# Patient Record
Sex: Female | Born: 1945
Health system: Southern US, Community
[De-identification: ages and names within clinical notes are randomized; demographics above are authoritative.]

## PROBLEM LIST (undated history)

## (undated) DIAGNOSIS — Z923 Personal history of irradiation: Secondary | ICD-10-CM

## (undated) DIAGNOSIS — Z720 Tobacco use: Secondary | ICD-10-CM

## (undated) DIAGNOSIS — J449 Chronic obstructive pulmonary disease, unspecified: Secondary | ICD-10-CM

## (undated) DIAGNOSIS — C50919 Malignant neoplasm of unspecified site of unspecified female breast: Secondary | ICD-10-CM

## (undated) HISTORY — DX: Personal history of irradiation: Z92.3

## (undated) HISTORY — PX: BREAST LUMPECTOMY: SHX2

---

## 2005-01-14 ENCOUNTER — Inpatient Hospital Stay: Payer: Self-pay | Admitting: Internal Medicine

## 2006-02-07 ENCOUNTER — Ambulatory Visit: Payer: Self-pay | Admitting: Infectious Diseases

## 2008-02-10 ENCOUNTER — Ambulatory Visit: Payer: Self-pay | Admitting: Family Medicine

## 2009-09-14 ENCOUNTER — Ambulatory Visit: Payer: Self-pay | Admitting: Unknown Physician Specialty

## 2010-06-27 ENCOUNTER — Ambulatory Visit: Payer: Self-pay | Admitting: Unknown Physician Specialty

## 2011-07-17 HISTORY — PX: BREAST BIOPSY: SHX20

## 2011-07-17 HISTORY — PX: BREAST LUMPECTOMY: SHX2

## 2011-11-05 ENCOUNTER — Ambulatory Visit: Payer: Self-pay | Admitting: Internal Medicine

## 2011-11-09 ENCOUNTER — Ambulatory Visit: Payer: Self-pay | Admitting: Internal Medicine

## 2011-12-13 ENCOUNTER — Ambulatory Visit: Payer: Self-pay | Admitting: Surgery

## 2011-12-19 ENCOUNTER — Ambulatory Visit: Payer: Self-pay | Admitting: Surgery

## 2011-12-19 LAB — CBC WITH DIFFERENTIAL/PLATELET
Basophil #: 0 10*3/uL (ref 0.0–0.1)
Eosinophil #: 0.1 10*3/uL (ref 0.0–0.7)
HGB: 15 g/dL (ref 12.0–16.0)
MCHC: 33.6 g/dL (ref 32.0–36.0)
Neutrophil %: 72.8 %
Platelet: 192 10*3/uL (ref 150–440)
RBC: 4.79 10*6/uL (ref 3.80–5.20)
WBC: 8.9 10*3/uL (ref 3.6–11.0)

## 2011-12-19 LAB — BASIC METABOLIC PANEL
BUN: 10 mg/dL (ref 7–18)
Calcium, Total: 8.7 mg/dL (ref 8.5–10.1)
Creatinine: 0.63 mg/dL (ref 0.60–1.30)
EGFR (African American): >60
Glucose: 81 mg/dL (ref 65–99)
Potassium: 4.1 mmol/L (ref 3.5–5.1)
Sodium: 141 mmol/L (ref 136–145)

## 2011-12-20 LAB — PATHOLOGY REPORT

## 2012-02-06 ENCOUNTER — Ambulatory Visit: Payer: Self-pay | Admitting: Surgery

## 2012-02-18 ENCOUNTER — Ambulatory Visit: Payer: Self-pay | Admitting: Oncology

## 2012-03-14 LAB — CBC CANCER CENTER
Basophil #: 0 x10 3/mm (ref 0.0–0.1)
Eosinophil #: 0.1 x10 3/mm (ref 0.0–0.7)
Eosinophil %: 1.5 %
Lymphocyte #: 2.2 x10 3/mm (ref 1.0–3.6)
MCH: 30.4 pg (ref 26.0–34.0)
MCV: 93 fL (ref 80–100)
Monocyte #: 0.7 x10 3/mm (ref 0.2–0.9)
Neutrophil %: 57.7 %
Platelet: 198 x10 3/mm (ref 150–440)
RBC: 4.67 10*6/uL (ref 3.80–5.20)
RDW: 13.9 % (ref 11.5–14.5)

## 2012-03-16 ENCOUNTER — Ambulatory Visit: Payer: Self-pay | Admitting: Oncology

## 2012-03-21 LAB — CBC CANCER CENTER
Basophil #: 0 x10 3/mm (ref 0.0–0.1)
Basophil %: 0.7 %
HCT: 41.8 % (ref 35.0–47.0)
HGB: 14.1 g/dL (ref 12.0–16.0)
Lymphocyte #: 2 x10 3/mm (ref 1.0–3.6)
MCH: 31 pg (ref 26.0–34.0)
MCHC: 33.7 g/dL (ref 32.0–36.0)
MCV: 92 fL (ref 80–100)
Monocyte %: 9.3 %
Neutrophil #: 3.7 x10 3/mm (ref 1.4–6.5)
Platelet: 203 x10 3/mm (ref 150–440)
RBC: 4.54 10*6/uL (ref 3.80–5.20)
RDW: 14 % (ref 11.5–14.5)

## 2012-03-28 LAB — CBC CANCER CENTER
Eosinophil #: 0.1 x10 3/mm (ref 0.0–0.7)
Eosinophil %: 1 %
HCT: 44.4 % (ref 35.0–47.0)
Lymphocyte #: 1.2 x10 3/mm (ref 1.0–3.6)
MCV: 94 fL (ref 80–100)
Monocyte %: 9.1 %
Neutrophil #: 4.9 x10 3/mm (ref 1.4–6.5)
Neutrophil %: 71.1 %
RBC: 4.75 10*6/uL (ref 3.80–5.20)
WBC: 6.8 x10 3/mm (ref 3.6–11.0)

## 2012-04-04 LAB — CBC CANCER CENTER
Basophil #: 0.1 x10 3/mm (ref 0.0–0.1)
Basophil %: 0.9 %
Eosinophil #: 0.1 x10 3/mm (ref 0.0–0.7)
HCT: 40.9 % (ref 35.0–47.0)
HGB: 13.9 g/dL (ref 12.0–16.0)
MCH: 31.3 pg (ref 26.0–34.0)
MCHC: 34 g/dL (ref 32.0–36.0)
Monocyte #: 0.6 x10 3/mm (ref 0.2–0.9)
Neutrophil #: 3.3 x10 3/mm (ref 1.4–6.5)
Neutrophil %: 59.6 %
RDW: 13.4 % (ref 11.5–14.5)
WBC: 5.5 x10 3/mm (ref 3.6–11.0)

## 2012-04-11 ENCOUNTER — Other Ambulatory Visit: Payer: Self-pay | Admitting: Internal Medicine

## 2012-04-11 LAB — TSH: Thyroid Stimulating Horm: 0.633 u[IU]/mL

## 2012-04-11 LAB — CBC CANCER CENTER
Basophil %: 0.6 %
Eosinophil #: 0.1 x10 3/mm (ref 0.0–0.7)
Eosinophil %: 2.2 %
HGB: 13.9 g/dL (ref 12.0–16.0)
Lymphocyte #: 1.1 x10 3/mm (ref 1.0–3.6)
MCH: 30.6 pg (ref 26.0–34.0)
MCHC: 32.5 g/dL (ref 32.0–36.0)
MCV: 94 fL (ref 80–100)
Monocyte #: 0.7 x10 3/mm (ref 0.2–0.9)
Monocyte %: 13.6 %
Neutrophil #: 3.3 x10 3/mm (ref 1.4–6.5)
RDW: 13.8 % (ref 11.5–14.5)

## 2012-04-15 ENCOUNTER — Ambulatory Visit: Payer: Self-pay | Admitting: Oncology

## 2012-04-18 LAB — CBC CANCER CENTER
Basophil #: 0.1 x10 3/mm (ref 0.0–0.1)
Basophil %: 0.9 %
Eosinophil #: 0.1 x10 3/mm (ref 0.0–0.7)
Lymphocyte #: 1.3 x10 3/mm (ref 1.0–3.6)
MCH: 30.6 pg (ref 26.0–34.0)
MCV: 94 fL (ref 80–100)
Monocyte #: 0.7 x10 3/mm (ref 0.2–0.9)
Platelet: 166 x10 3/mm (ref 150–440)
RDW: 13.7 % (ref 11.5–14.5)
WBC: 5.7 x10 3/mm (ref 3.6–11.0)

## 2012-04-25 LAB — CBC CANCER CENTER
Basophil #: 0 x10 3/mm (ref 0.0–0.1)
Basophil %: 0.5 %
Eosinophil %: 1.9 %
HGB: 13.9 g/dL (ref 12.0–16.0)
Lymphocyte %: 24.2 %
MCV: 93 fL (ref 80–100)
Monocyte %: 16.5 %
Neutrophil #: 2.6 x10 3/mm (ref 1.4–6.5)
Neutrophil %: 56.9 %
RDW: 13.6 % (ref 11.5–14.5)

## 2012-05-16 ENCOUNTER — Ambulatory Visit: Payer: Self-pay | Admitting: Oncology

## 2012-06-15 ENCOUNTER — Ambulatory Visit: Payer: Self-pay | Admitting: Oncology

## 2012-07-16 ENCOUNTER — Ambulatory Visit: Payer: Self-pay | Admitting: Oncology

## 2012-08-19 ENCOUNTER — Ambulatory Visit: Payer: Self-pay | Admitting: Oncology

## 2012-09-13 ENCOUNTER — Ambulatory Visit: Payer: Self-pay | Admitting: Oncology

## 2012-10-23 ENCOUNTER — Ambulatory Visit: Payer: Self-pay | Admitting: Internal Medicine

## 2012-10-30 ENCOUNTER — Ambulatory Visit: Payer: Self-pay | Admitting: Oncology

## 2012-10-30 ENCOUNTER — Ambulatory Visit: Payer: Self-pay | Admitting: Internal Medicine

## 2013-04-20 ENCOUNTER — Observation Stay: Payer: Self-pay | Admitting: Internal Medicine

## 2013-04-20 LAB — URINALYSIS, COMPLETE
Bacteria: NONE SEEN
Glucose,UR: NEGATIVE mg/dL (ref 0–75)
Glucose,UR: NEGATIVE mg/dL (ref 0–75)
Leukocyte Esterase: NEGATIVE
Nitrite: NEGATIVE
Protein: NEGATIVE
RBC,UR: <1 /HPF (ref 0–5)
Specific Gravity: 1.01 (ref 1.003–1.030)
WBC UR: 1 /HPF (ref 0–5)
WBC UR: 2 /HPF (ref 0–5)

## 2013-04-20 LAB — COMPREHENSIVE METABOLIC PANEL
Albumin: 3.8 g/dL (ref 3.4–5.0)
Anion Gap: 2 — ABNORMAL LOW (ref 7–16)
Bilirubin,Total: 0.4 mg/dL (ref 0.2–1.0)
Calcium, Total: 9.1 mg/dL (ref 8.5–10.1)
EGFR (African American): >60
EGFR (Non-African Amer.): >60
Glucose: 82 mg/dL (ref 65–99)
Osmolality: 274 (ref 275–301)
SGPT (ALT): 19 U/L (ref 12–78)
Sodium: 139 mmol/L (ref 136–145)
Total Protein: 7.5 g/dL (ref 6.4–8.2)

## 2013-04-20 LAB — CBC WITH DIFFERENTIAL/PLATELET
Eosinophil %: 0.3 %
HCT: 46.4 % (ref 35.0–47.0)
HGB: 15.7 g/dL (ref 12.0–16.0)
Lymphocyte #: 1.3 10*3/uL (ref 1.0–3.6)
Monocyte %: 6.8 %
Neutrophil %: 74.1 %
Platelet: 180 10*3/uL (ref 150–440)
RBC: 4.97 10*6/uL (ref 3.80–5.20)
RDW: 14 % (ref 11.5–14.5)

## 2013-04-20 LAB — LIPASE, BLOOD: Lipase: 157 U/L (ref 73–393)

## 2013-04-20 LAB — T4, FREE: Free Thyroxine: 1.49 ng/dL — ABNORMAL HIGH (ref 0.76–1.46)

## 2013-04-21 LAB — CBC WITH DIFFERENTIAL/PLATELET
Basophil %: 0.6 %
Eosinophil #: 0.1 10*3/uL (ref 0.0–0.7)
Eosinophil %: 1.6 %
HCT: 40.6 % (ref 35.0–47.0)
Lymphocyte #: 1.5 10*3/uL (ref 1.0–3.6)
Lymphocyte %: 25.8 %
MCHC: 34.4 g/dL (ref 32.0–36.0)
MCV: 94 fL (ref 80–100)
Monocyte #: 0.6 x10 3/mm (ref 0.2–0.9)
Monocyte %: 10.6 %
Neutrophil #: 3.5 10*3/uL (ref 1.4–6.5)
Neutrophil %: 61.4 %
Platelet: 169 10*3/uL (ref 150–440)
RBC: 4.33 10*6/uL (ref 3.80–5.20)
RDW: 14.1 % (ref 11.5–14.5)

## 2013-04-21 LAB — BASIC METABOLIC PANEL
Anion Gap: 5 — ABNORMAL LOW (ref 7–16)
Calcium, Total: 8.6 mg/dL (ref 8.5–10.1)
Creatinine: 0.68 mg/dL (ref 0.60–1.30)
EGFR (African American): >60
Glucose: 74 mg/dL (ref 65–99)
Osmolality: 275 (ref 275–301)

## 2013-04-21 LAB — LIPID PANEL
Cholesterol: 159 mg/dL (ref 0–200)
Ldl Cholesterol, Calc: 102 mg/dL — ABNORMAL HIGH (ref 0–100)
Triglycerides: 67 mg/dL (ref 0–200)
VLDL Cholesterol, Calc: 13 mg/dL (ref 5–40)

## 2013-04-21 LAB — TSH: Thyroid Stimulating Horm: 0.97 u[IU]/mL

## 2013-04-22 LAB — PATHOLOGY REPORT

## 2013-05-05 ENCOUNTER — Ambulatory Visit: Payer: Self-pay | Admitting: Urgent Care

## 2013-05-12 ENCOUNTER — Ambulatory Visit: Payer: Self-pay | Admitting: Urgent Care

## 2013-11-25 ENCOUNTER — Ambulatory Visit: Payer: Self-pay | Admitting: Surgery

## 2014-07-19 DIAGNOSIS — M7989 Other specified soft tissue disorders: Secondary | ICD-10-CM | POA: Diagnosis not present

## 2014-07-19 DIAGNOSIS — E039 Hypothyroidism, unspecified: Secondary | ICD-10-CM | POA: Diagnosis not present

## 2014-07-19 DIAGNOSIS — H9212 Otorrhea, left ear: Secondary | ICD-10-CM | POA: Diagnosis not present

## 2014-07-28 DIAGNOSIS — M67442 Ganglion, left hand: Secondary | ICD-10-CM | POA: Diagnosis not present

## 2014-08-20 DIAGNOSIS — H6060 Unspecified chronic otitis externa, unspecified ear: Secondary | ICD-10-CM | POA: Diagnosis not present

## 2014-08-20 DIAGNOSIS — H9190 Unspecified hearing loss, unspecified ear: Secondary | ICD-10-CM | POA: Diagnosis not present

## 2014-10-14 ENCOUNTER — Ambulatory Visit
Admit: 2014-10-14 | Disposition: A | Discharge status: Home / Self Care | Payer: Self-pay | Attending: Specialist | Admitting: Specialist

## 2014-10-21 DIAGNOSIS — R0602 Shortness of breath: Secondary | ICD-10-CM | POA: Diagnosis not present

## 2014-10-21 DIAGNOSIS — E039 Hypothyroidism, unspecified: Secondary | ICD-10-CM | POA: Diagnosis not present

## 2014-10-21 DIAGNOSIS — Z77120 Contact with and (suspected) exposure to mold (toxic): Secondary | ICD-10-CM | POA: Diagnosis not present

## 2014-10-21 DIAGNOSIS — R5382 Chronic fatigue, unspecified: Secondary | ICD-10-CM | POA: Diagnosis not present

## 2014-10-21 DIAGNOSIS — R05 Cough: Secondary | ICD-10-CM | POA: Diagnosis not present

## 2014-10-27 DIAGNOSIS — R05 Cough: Secondary | ICD-10-CM | POA: Diagnosis not present

## 2014-10-27 DIAGNOSIS — Z77120 Contact with and (suspected) exposure to mold (toxic): Secondary | ICD-10-CM | POA: Diagnosis not present

## 2014-10-29 ENCOUNTER — Other Ambulatory Visit: Payer: Self-pay | Admitting: Internal Medicine

## 2014-10-29 DIAGNOSIS — Z853 Personal history of malignant neoplasm of breast: Secondary | ICD-10-CM

## 2014-11-02 NOTE — Consult Note (Signed)
Reason for Visit: This 69 year old Female patient presents to the clinic for initial evaluation of  Breast cancer .   Referred by Dr. Tamala Julian.  Diagnosis:   Chief Complaint/Diagnosis   69 year old female with stage 0 (Tis N0 M0) ductal carcinoma in situ ER positive PR negative status post wide local excision   Pathology Report Pathology were reviewed    Imaging Report Mammograms and ultrasound reviewed    Referral Report Clinical notes reviewed    Planned Treatment Regimen Adjuvant whole breast radiation    HPI   patient is a pleasant 69 year old female who presents with an abnormal mammogram of the right breast. There were actually 2 separate groups of suspicious microcalcifications in the superior lateral right breast she underwent stereotactic biopsy positive for ductal carcinoma in situ. She then underwent wide local excision for a 1 cm DCIS in the inferior medial portion upper quadrant and a 0.6 cm DCIS lesion in the superior lateral portion of the quadrant. It was apparent these were abutting each other and probably consists of the same lesion. Tumor again was ER positive PR negative. Margins were clear but close at 0.5 mm and 1 mm respectively. She has done well since her surgery. She is has no complaints. She specifically denies breast tenderness cough or bone pain. She has been seen by medical oncology whose May recommendation for post treatment tamoxifen. She seen today for radiation oncology opinion.  Past Hx:    right breast cancer: 2013   Tobacco Use:    Ovarian Cyst:    Thyroidectomy:   Past, Family and Social History:   Past Medical History positive    Endocrine hypothyroidism; Status post thyroidectomy    Past Surgical History Benign ovarian cyst removed    Family History positive    Family History Comments Of with prostate cancer, mother with COPD and thyroid disease    Social History positive    Social History Comments 40-pack-year smoking history, no EtOH  abuse history    Additional Past Medical and Surgical History Patient is seen by yourself today   Allergies:   Amoxicillin: Anaphylaxis  Morphine: N/V  Sulfa drugs: N/V, Other  Seafood: N/V  Celebrex: Rash  Prednisone: Swelling  Splenda causes dizziness: Other  Proventil: Other, Resp. Distress, Tachycardia  Home Meds:  Home Medications: Medication Instructions Status  levothyroxine 50 MCG DAILY  Active  Norco 5 mg-325 mg oral tablet 1 tab(s) orally as directed Active   Review of Systems:   General negative    Performance Status (ECOG) 0    Skin negative    Breast see HPI    Ophthalmologic negative    ENMT negative    Respiratory and Thorax negative    Cardiovascular negative    Gastrointestinal negative    Genitourinary negative    Musculoskeletal negative    Neurological negative    Psychiatric negative    Hematology/Lymphatics negative    Endocrine negative    Allergic/Immunologic negative    Review of Systems   Patient denies any weight loss, fatigue, weakness, fever, chills or night sweats. Patient denies any loss of vision, blurred vision. Patient denies any ringing  of the ears or hearing loss. No irregular heartbeat. Patient denies heart murmur or history of fainting. Patient denies any chest pain or pain radiating to her upper extremities. Patient denies any shortness of breath, difficulty breathing at night, cough or hemoptysis. Patient denies any swelling in the lower legs. Patient denies any nausea vomiting, vomiting of blood,  or coffee ground material in the vomitus. Patient denies any stomach pain. Patient states has had normal bowel movements no significant constipation or diarrhea. Patient denies any dysuria, hematuria or significant nocturia. Patient denies any problems walking, swelling in the joints or loss of balance. Patient denies any skin changes, loss of hair or loss of weight. Patient denies any excessive worrying or anxiety or  significant depression. Patient denies any problems with insomnia. Patient denies excessive thirst, polyuria, polydipsia. Patient denies any swollen glands, patient denies easy bruising or easy bleeding. Patient denies any recent infections, allergies or URI. Patient "s visual fields have not changed significantly in recent time.  Nursing Notes:  Nursing Vital Signs and Chemo Nursing Nursing Notes: *CC Vital Signs Flowsheet:   05-Aug-13 15:08   Temp Temperature 99   Pulse Pulse 70   Respirations Respirations 22   SBP SBP 158   DBP DBP 85   Pain Scale (0-10)  0   Current Weight (kg) (kg) 77.2   Height (cm) centimeters 168   BSA (m2) 1.8   Physical Exam:  General/Skin/HEENT:   General normal    Skin normal    Eyes normal    ENMT normal    Head and Neck normal    Additional PE Well-developed well-nourished female in NAD. Right breast has a recent wide local excision scar which is healed well. No dominant mass or nodularity is noted in either breast into position examined. No axillary or supraclavicular adenopathy is appreciated.   Breasts/Resp/CV/GI/GU:   Respiratory and Thorax normal    Cardiovascular normal    Gastrointestinal normal    Genitourinary normal   MS/Neuro/Psych/Lymph:   Musculoskeletal normal    Neurological normal    Lymphatics normal   Assessment and Plan:  Impression:   ductal carcinoma in situ of the right breast status post wide local excision ER positive PR negative in 69 year old female  Plan:   at this time based on the close margins. The 2 separate nodules believe the patient would benefit from whole breast radiation therapy. We treated to 5000 cGy and boost her margins another 2000 cGy based on the close margin of 0.5 mm. Patient also be a candidate after radiation for tamoxifen therapy. Risks and benefits of treatment including skin reaction, fatigue and some inclusion of superficial lung were all explained in detail to the patient. I have  set her up for CT simulation next week.  I would like to take this opportunity to thank you for allowing me to continue to participate in this patient's care.  CC Referral:   cc: Dr. Tamala Julian, Dr. Marla Roe   Electronic Signatures: Baruch Gouty, Roda Shutters (MD)  (Signed 09-Aug-13 14:09)  Authored: HPI, Diagnosis, Past Hx, PFSH, Allergies, Home Meds, ROS, Nursing Notes, Physical Exam, Encounter Assessment and Plan, CC Referring Physician   Last Updated: 09-Aug-13 14:09 by Armstead Peaks (MD)

## 2014-11-02 NOTE — Op Note (Signed)
PATIENT NAME:  Kristin Vega, Kristin Vega MR#:  [id number] DATE OF BIRTH:  [birth year]  DATE OF PROCEDURE:  02/06/2012  PREOPERATIVE DIAGNOSIS: Ductal carcinoma and in situ of the upper outer quadrant right breast.   POSTOPERATIVE DIAGNOSIS: Ductal carcinoma and in situ of the upper outer quadrant right breast.   PROCEDURE: Right partial mastectomy.   SURGEON: Loreli Dollar, MD  ANESTHESIA: General.  INDICATIONS: This 69 year old female recently had a mammogram depicting two foci microcalcifications. She had stereotactic needle biopsies demonstrating both sites ductal carcinoma in situ and surgery was recommended for definitive treatment. The patient did have preoperative x-ray needle localization. These images were reviewed and appeared to be very accurate localizations identifying the biopsy sites.   DESCRIPTION OF PROCEDURE: The patient was placed on the operating table in the supine position under general anesthesia. The dressing was removed from the right breast. Kopans wires were identified and were cut 3 cm from the skin. The breast was prepared with ChloraPrep, draped in a sterile manner.   The needle entrance sites were observed in the upper outer quadrant. An incision was made so as to encompass both sites and it was obliquely oriented beginning just lateral to the nipple and extending up towards the axilla and removed an ellipse of skin which was approximately 1.5 cm wide and dissected down through subcutaneous tissues and dissected around the sites of the Kopans wires and dissected down into the glandular tissue of the breast down adjacent to the muscle wall and excised the portion of tissue. It appeared that the margin was somewhat minimal just above the wire in the upper outer quadrant and another excision of a portion of tissue approximately 3 x 3 x 7 mm in thickness was made and this was sutured with three-point fixation to the larger mass to allow the pathologist to tell where the  re-excision had taken place. The 4:00 end of the skin ellipse was tagged with a stitch for the pathologist's orientation and the specimen was submitted for gross inspection and routine pathology. The wound was inspected. There was no remaining mass visible or palpable within the wound. Several small bleeding points were cauterized. Hemostasis subsequently appeared to be intact. The wound was injected with 16 mL of 0.5% Sensorcaine with epinephrine. Next, the subcutaneous tissues were approximated with 5-0 Monocryl and then the skin was closed with running 5-0 Monocryl subcuticular suture.   Pathologist subsequently called back to report there was no grossly apparent tumor at the cut margins and it was somewhat difficult to identify the biopsy site and tissue submitted for permanent studies. Next, the wound was dressed with Dermabond, allowed to dry. The patient tolerated surgery satisfactorily and was then prepared for transfer to the recovery room.  ____________________________ Lenna Sciara. Rochel Brome, MD jws:cms D: 02/06/2012 11:09:57 ET T: 02/06/2012 11:32:20 ET JOB#: [id number]  cc: Loreli Dollar, MD, <Dictator>  Report entered as incorrect report type; entered as an history and physical and should be an operative report.   Loreli Dollar MD ELECTRONICALLY SIGNED 02/06/2012 18:58

## 2014-11-05 NOTE — Discharge Summary (Signed)
PATIENT NAME:  Kristin Vega, Kristin Vega MR#:  [id number] DATE OF BIRTH:  [birth year]  DATE OF ADMISSION:  04/20/2013 DATE OF DISCHARGE:  04/21/2013  ADMISSION DIAGNOSIS: Dysphagia.   DISCHARGE DIAGNOSIS: Dysphagia with a small hiatal hernia on EGD showing a benign-appearing esophageal stricture status post dilation.   CONSULTATIONS: Dr. Allen Norris.   PROCEDURES: The patient underwent an EGD which showed a hiatal hernia, benign-appearing esophageal stricture which was dilated, gastritis and a normal examined duodenum.   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   LABORATORY DATA ON DISCHARGE: White blood cells is 5.6, hemoglobin 13.9, hematocrit 40.6, platelets are 169. Sodium 140, potassium 3.9, chloride 108, bicarbonate 27, BUN 5, creatinine 0.68, glucose 74. LDL of 102, VLDL 13, HDL 44, triglyceride 67, cholesterol  159. TSH 0.970.  HOSPITAL COURSE: This is a 69 year old female who presented to the hospital with dysphasia. For further details, please refer to the H and P.  1. Dysphagia. The patient said that she been having dysphagia for four weeks. She says it is to  both solids and liquids; however, then she does state that she is able to drink milk and eat raisin bran cereal. Consultation was performed with by GI Vickey Huger and EGD was performed by Dr. Allen Norris. It did show a hiatal hernia and she did have an esophageal stricture which was dilated, which is likely the cause of her dysphagia. Biopsies were taken to rule out eosinophilic gastritis. She will continue on a PPI and have follow-up with Vickey Huger in 7 to 10 days.  2. Hypothyroidism. She was continued on Synthroid.  3. Smoking dependence. We encouraged the patient to stop smoking. She was discharged with a nicotine patch.   DISCHARGE MEDICATIONS:  1. Synthroid 75 mcg daily.  2. Nicotine patch 21 mg per 24 hours.  3. Pantoprazole 40 mg daily.   DISCHARGE DIET: Regular diet.   DISCHARGE ACTIVITY: As tolerated.   DISCHARGE  FOLLOW-UP: The patient will follow up with Vickey Huger, nurse practioner for  Dr. Allen Norris in 10 days.   TIME SPENT: Approximately 35 minutes.  The patient is medically stable for discharge.   ____________________________ Rebekka Lobello P. Benjie Karvonen, MD spm:sg D: 04/21/2013 12:11:27 ET T: 04/21/2013 12:37:53 ET JOB#: [id number]  cc: Dewitt Judice P. Benjie Karvonen, MD, <Dictator> Andria Meuse, NP Lucilla Lame, MD  Donell Beers Saranya Harlin MD ELECTRONICALLY SIGNED 04/21/2013 20:48

## 2014-11-05 NOTE — H&P (Signed)
PATIENT NAME:  Kristin Vega, Kristin Vega MR#:  [id number] DATE OF BIRTH:  24-Aug-1945  DATE OF ADMISSION:  04/20/2013  PRIMARY CARE PHYSICIAN: Valentino Saxon, MD  REFERRING PHYSICIAN: Delman Kitten, MD   CHIEF COMPLAINT: Swallowing difficulty.   HISTORY OF PRESENT ILLNESS: A 69 year old female who has history of hypothyroidism following hemithyroidectomy due to thyroid nodules which were benign 10 years ago and since then on thyroid replacement. Because of her breast cancer and lumpectomy, she received tamoxifen 10 months ago for 3 months scores and stopped in January 2014. She had to stop it because of severe reactions, and she was feeling very weak and swollen and breaking out.  So, they had to stop tamoxifen, and since she stopped tamoxifen she never recovered fully. She has been gaining weight, almost 20 pounds in the last 7 to 8 months. She has been losing her hair, and she feels her voice is hoarse. Also, she felt some change in her taste sensation, and she feels nauseated even by looking at some of the foods.  For the last few weeks, her swallowing problem got so worse that she could hardly eat or drink some food, and so she spoke to her surgeon doctor who did the breast lumpectomy, and he referred to GI clinic.  She saw the PA of Dr. Rayann Heman in clinic, and they scheduled her for endoscopy at the end of this month, but she could not tolerate this problem anymore and decided to come to Emergency Room today. On initial work-up, her laboratory results are within acceptable range; but because of this progressive complaint and not able to tolerate food properly, we are going to admit her under observation to get the work-up and GI consult faster.   PAST MEDICAL HISTORY:   1.  Hypothyroidism, on replacement, had some thyroid nodules and hemithyroidectomy done 10 years ago.  2.  Breast cancer which was localized to the lump, and they did lumpectomy and received tamoxifen for 3 months.   PAST SURGICAL HISTORY:  Thyroid surgery and breast lumpectomy.   SOCIAL HISTORY: She is a smoker, is smoking 1 pack per day. Denies alcohol or denies any drug use. She is retired now and is on [employer] income. She is widowed.   FAMILY HISTORY: Father has some kidney problem, but he died when she was 69 years old, so does not know any more history.  Mother had thyroid problems, and she died at 19 because of severe complications of COPD.  Her cousin has breast and uterine cancer.   HOME MEDICATIONS: Levothyroxine 75 mcg once daily.   PHYSICAL EXAMINATION:  VITAL SIGNS: In the ER, temperature 98.6, pulse 79, respirations 18, and blood pressure 143/73, pulse ox 96 on room air.  GENERAL: The patient is fully alert and oriented to time, place and person and does not appear in any acute distress.  HEENT: Head and neck atraumatic. Conjunctivae pink. Oral mucosa moist.  NECK: Supple. No JVD.  RESPIRATORY: Bilateral clear and equal air entry.  CARDIOVASCULAR: S1, S2 present, regular. No murmur.  ABDOMEN: Soft, nontender, bowel sounds present. No organomegaly.  SKIN: No rashes.  LEGS: No edema.  NEUROLOGICAL: Power 5 out of 5. Follows commands. No gross abnormality  PSYCHIATRIC: Does not appear in any acute psychiatric illness at this time.   IMPORTANT LAB AND DIAGNOSTIC RESULTS:   Glucose 82, BUN 4, creatinine 0.79, sodium 139, potassium 4.2, chloride 106, CO2 31, lipase 157, total protein 7.5, bilirubin 0.4, alkaline phosphatase 120, SGOT 22, SGPT 19.  WBC 7.1, hemoglobin 15.7, platelet count 180, MCV is 93. Urinalysis is grossly negative. Chest x-ray, PA and lateral: No acute cardiopulmonary disease. Changes of COPD and atherosclerotic disease.  Soft tissue neck:    ASSESSMENT AND PLAN: A 69 year old female with past medical history of hemithyroidectomy due to thyroid nodules for the last 7 to 8 months, has been feeling weak, fatigued, not eating well due to swallowing problem, losing hair and gaining weight and has  hoarse sound.   Denies any temperature intolerance or constipation.   1.  Dysphagia:  She was scheduled to undergo outpatient endoscopy by Dr. Jackalyn Lombard office.  Will get barium swallow done and will call GI consult for further management of this issue. Most likely it might be due to hormonal imbalance.  2.  Possible hypothyroidism:  As the patient's symptoms are suggesting towards hypothyroidism, we will get TSH, free T3 and free T4 levels stat, and, if needed, we might consult Endocrinology for further management. There is no thyroid enlargement on gross examination.  3.  Tobacco abuse: She is a smoker, currently smokes 1 pack per day. Smoking cessation counseling done for 5 minutes and offered a nicotine patch.   CODE STATUS:  FULL CODE.    TOTAL TIME SPENT: 50 minutes in this admission.   ____________________________ Ceasar Lund Anselm Jungling, MD vgv:cb D: 04/20/2013 15:52:08 ET T: 04/20/2013 16:33:43 ET JOB#: [id number]  cc: Ceasar Lund. Anselm Jungling, MD, <Dictator> Vaughan Basta MD ELECTRONICALLY SIGNED 04/27/2013 23:00

## 2014-11-05 NOTE — Consult Note (Signed)
Brief Consult Note: Diagnosis: Dysphagia.   Patient was seen by consultant.   Consult note dictated.   Comments: Kristin Vega is a pleasant 69 y/o caucasian female with chronic dysphagia & globus.  She will have EGD with Dr Allen Norris tomorrow with possible esophageal dilation to look for occult web, ring, stricture or post-radiation changes.  Plan: 1) NPO after MN 2) EGD with Dr Allen Norris in AM 3) PPI 4) Outpatient screening colonoscopy recommended 5) Hold heparin 2300 for procedure  Thanks for Consult.  Please see full dictated note [phone].  Electronic Signatures: Andria Meuse (NP)  (Signed 06-Oct-14 19:30)  Authored: Brief Consult Note   Last Updated: 06-Oct-14 19:30 by Andria Meuse (NP)

## 2014-11-05 NOTE — Consult Note (Signed)
PATIENT NAME:  Kristin Vega, Kristin Vega MR#:  [id number] DATE OF BIRTH:  [birth year]  DATE OF CONSULTATION:  04/20/2013  CONSULTANT:  Dr. Lucilla Lame   PRIMARY CARE PHYSICIAN: Previously Dr. Heber Shongopovi, has upcoming appointment with Dr. Candiss Norse.   REQUESTING PHYSICIAN: Dr. Anselm Jungling, Gastroenterologist  PRIMARY GASTROENTEROLOGIST: Dr. Rayann Heman  REASON FOR CONSULTATION: Dysphagia.   HISTORY OF PRESENT ILLNESS: Kristin Vega is a 69 year old Caucasian female who has had chronic dysphagia. She was actually seen by Dr. Jackalyn Lombard PA, Maurice March, and scheduled for EGD at the end of the month, but says she could not tolerate the problem any longer, and decided to come to the ER today. She reports even if she looks at food, she begins gagging. She has had symptoms for at least 5 weeks now. She complains of a globus sensation. She has problems with both solids and liquids. Even chicken broth seems to well up in the back of her throat, and will not go down. She has had frequent belching. She complains of gurgling within an hour of eating in her stomach. She complains of frequent gas and bloating. She has heartburn for the first time ever after eating stuffed peppers a couple of weeks ago. She has had indigestion for the last 5 weeks. She has had episodes where she feels like she is strangling with coughing spells. She has a history of breast cancer diagnosed in April 2013, status post lumpectomy, chemotherapy and radiation that she completed about one year ago. She has chronic intermittent nausea without vomiting. She gained about 20 pounds last year, but her weight has been steady over the past year. She denies rectal bleeding or melena. Denies any diarrhea or constipation. She has never had a screening colonoscopy.   She had a CT soft tissue of her neck, which was benign. She had a chest x-ray, which showed changes of COPD and atherosclerotic disease. She had a barium swallow, which showed a small hiatal hernia with a small B  ring of the distal esophagus.   PAST SURGICAL HISTORY: Thyroid nodule, status post partial thyroidectomy, breast cancer diagnosed April 2013, followed by chemotherapy and radiation, as well as lumpectomies.    PAST MEDICAL HISTORY: COPD.   MEDICATIONS PRIOR TO ADMISSION:  Levothyroxine 75 mcg daily.   ALLERGIES: AMOXICILLIN caused anaphylaxis. CELEBREX, rash. MORPHINE, nausea and vomiting. PREDNISONE, swelling. PROVENTIL, respiratory distress and tachycardia. SULFA, nausea and vomiting. SPLENDA, dizziness. SEAFOOD, nausea and vomiting.   FAMILY HISTORY: There is no known family history of colorectal carcinoma, liver or chronic GI problems. Mother deceased secondary to respiratory issues, she also had thyroid disease.   SOCIAL HISTORY:  She is divorced. She lives alone. She has one healthy daughter, who lives in [place]. She has 2 grandchildren. She occasionally consumes a glass of wine. Denies any illicit drug use. She has a 30+ pack-year history of tobacco use. She is retired.   REVIEW OF SYSTEMS: See HPI, otherwise negative 12-point review of systems.  PHYSICAL EXAMINATION: VITAL SIGNS: Temp 98.5, pulse 62, respirations 18, blood pressure 150/86, O2 sat 96% on room air.  GENERAL: She is well-developed, well-nourished, very talkative, pleasant, cooperative, Caucasian female in no acute distress.  HEENT: Sclerae clear, nonicteric. Conjunctivae pink. Oropharynx pink and moist, without any lesions.  NECK: Supple, without mass or thyromegaly.  CHEST: Heart regular rate and rhythm. Normal S1, S2. No murmurs, clicks, rubs or gallops.  LUNGS: With mild expiratory wheeze bilaterally. No acute distress. Good air movement.  ABDOMEN: Positive bowel sounds x 4. No  bruits auscultated. Abdomen is mildly distended, soft, nontender, without palpable hepatosplenomegaly or mass.  EXTREMITIES: Without clubbing or edema.  SKIN: Pink, warm and dry, without any rash or jaundice.  MUSCULOSKELETAL: Good equal  strength and movement bilaterally.  NEUROLOGIC:  Grossly intact.  PSYCHIATRIC:  Alert, pleasant. Normal mood and affect.   LABORATORY STUDIES: BUN 4, otherwise normal BMP. Lipase 157. LFTs normal. Free thyroxine 1.49 TSH 0.505. CBC normal.   IMPRESSION: Kristin Vega is a pleasant, 69 year old Caucasian female with chronic dysphagia and globus. She will have EGD with Dr. Allen Norris tomorrow, with possible esophageal dilation to look for occult web, ring, stricture, or post-radiation changes. She has had a soft tissue CT of her neck, chest x-ray, and barium swallow, which showed a B ring and small hiatal hernia.   PLAN: 1. Nothing by mouth after midnight. EGD with Dr. Allen Norris in the morning, with possible esophageal dilation and/or biopsies. I discussed the procedure, including the risks and benefits, which include but are not limited to bleeding, infection, perforation, drug rash. She agrees with the plan, consent will be obtained.  2.  Begin PPI.  3.  Outpatient screening colonoscopy recommended.  4. Will hold her heparin starting at 11:00 p.m. for procedures tomorrow.  We would like to thank you for allowing [place] to participate in the care of Kristin Vega.   ____________________________ Andria Meuse, NP klj:mr D: 04/20/2013 19:30:12 ET T: 04/20/2013 20:15:08 ET JOB#: [id number]  cc: Andria Meuse, NP, <Dictator> DR. Moclips FNP ELECTRONICALLY SIGNED 05/06/2013 9:53

## 2014-11-29 ENCOUNTER — Other Ambulatory Visit: Payer: Self-pay

## 2014-12-29 DIAGNOSIS — F17200 Nicotine dependence, unspecified, uncomplicated: Secondary | ICD-10-CM | POA: Diagnosis not present

## 2014-12-29 DIAGNOSIS — E890 Postprocedural hypothyroidism: Secondary | ICD-10-CM | POA: Diagnosis not present

## 2014-12-29 DIAGNOSIS — R5382 Chronic fatigue, unspecified: Secondary | ICD-10-CM | POA: Diagnosis not present

## 2014-12-29 DIAGNOSIS — R05 Cough: Secondary | ICD-10-CM | POA: Diagnosis not present

## 2015-03-09 DIAGNOSIS — J069 Acute upper respiratory infection, unspecified: Secondary | ICD-10-CM | POA: Diagnosis not present

## 2015-03-16 DIAGNOSIS — J069 Acute upper respiratory infection, unspecified: Secondary | ICD-10-CM | POA: Diagnosis not present

## 2015-07-20 DIAGNOSIS — E039 Hypothyroidism, unspecified: Secondary | ICD-10-CM | POA: Diagnosis not present

## 2015-07-27 DIAGNOSIS — E039 Hypothyroidism, unspecified: Secondary | ICD-10-CM | POA: Diagnosis not present

## 2015-07-27 DIAGNOSIS — Z0000 Encounter for general adult medical examination without abnormal findings: Secondary | ICD-10-CM | POA: Diagnosis not present

## 2015-07-27 DIAGNOSIS — Z23 Encounter for immunization: Secondary | ICD-10-CM | POA: Diagnosis not present

## 2015-07-27 DIAGNOSIS — Z1239 Encounter for other screening for malignant neoplasm of breast: Secondary | ICD-10-CM | POA: Diagnosis not present

## 2015-07-28 ENCOUNTER — Other Ambulatory Visit: Payer: Self-pay | Admitting: Internal Medicine

## 2015-07-28 DIAGNOSIS — Z1239 Encounter for other screening for malignant neoplasm of breast: Secondary | ICD-10-CM

## 2015-08-18 ENCOUNTER — Ambulatory Visit
Admission: RE | Admit: 2015-08-18 | Discharge: 2015-08-18 | Disposition: A | Discharge status: Home / Self Care | Payer: PPO | Source: Ambulatory Visit | Attending: Internal Medicine | Admitting: Internal Medicine

## 2015-08-18 ENCOUNTER — Other Ambulatory Visit: Payer: Self-pay | Admitting: Internal Medicine

## 2015-08-18 DIAGNOSIS — Z1239 Encounter for other screening for malignant neoplasm of breast: Secondary | ICD-10-CM

## 2015-08-18 DIAGNOSIS — Z1231 Encounter for screening mammogram for malignant neoplasm of breast: Secondary | ICD-10-CM | POA: Diagnosis not present

## 2015-08-18 DIAGNOSIS — Z9889 Other specified postprocedural states: Secondary | ICD-10-CM | POA: Insufficient documentation

## 2015-08-18 DIAGNOSIS — R928 Other abnormal and inconclusive findings on diagnostic imaging of breast: Secondary | ICD-10-CM | POA: Diagnosis not present

## 2015-08-27 DIAGNOSIS — J019 Acute sinusitis, unspecified: Secondary | ICD-10-CM | POA: Diagnosis not present

## 2015-08-27 DIAGNOSIS — B9689 Other specified bacterial agents as the cause of diseases classified elsewhere: Secondary | ICD-10-CM | POA: Diagnosis not present

## 2015-11-24 DIAGNOSIS — E039 Hypothyroidism, unspecified: Secondary | ICD-10-CM | POA: Diagnosis not present

## 2015-12-01 DIAGNOSIS — E039 Hypothyroidism, unspecified: Secondary | ICD-10-CM | POA: Diagnosis not present

## 2016-04-20 DIAGNOSIS — J019 Acute sinusitis, unspecified: Secondary | ICD-10-CM | POA: Diagnosis not present

## 2016-04-20 DIAGNOSIS — B9689 Other specified bacterial agents as the cause of diseases classified elsewhere: Secondary | ICD-10-CM | POA: Diagnosis not present

## 2016-04-20 DIAGNOSIS — J208 Acute bronchitis due to other specified organisms: Secondary | ICD-10-CM | POA: Diagnosis not present

## 2016-05-29 DIAGNOSIS — R05 Cough: Secondary | ICD-10-CM | POA: Diagnosis not present

## 2016-05-29 DIAGNOSIS — J439 Emphysema, unspecified: Secondary | ICD-10-CM | POA: Diagnosis not present

## 2016-05-29 DIAGNOSIS — R0609 Other forms of dyspnea: Secondary | ICD-10-CM | POA: Diagnosis not present

## 2016-06-12 DIAGNOSIS — H6060 Unspecified chronic otitis externa, unspecified ear: Secondary | ICD-10-CM | POA: Diagnosis not present

## 2016-06-15 DIAGNOSIS — R0609 Other forms of dyspnea: Secondary | ICD-10-CM | POA: Diagnosis not present

## 2016-06-15 DIAGNOSIS — J449 Chronic obstructive pulmonary disease, unspecified: Secondary | ICD-10-CM | POA: Diagnosis not present

## 2016-07-20 DIAGNOSIS — E039 Hypothyroidism, unspecified: Secondary | ICD-10-CM | POA: Diagnosis not present

## 2016-07-20 DIAGNOSIS — E7800 Pure hypercholesterolemia, unspecified: Secondary | ICD-10-CM | POA: Diagnosis not present

## 2016-07-25 DIAGNOSIS — L570 Actinic keratosis: Secondary | ICD-10-CM | POA: Diagnosis not present

## 2016-07-25 DIAGNOSIS — L821 Other seborrheic keratosis: Secondary | ICD-10-CM | POA: Diagnosis not present

## 2016-07-30 ENCOUNTER — Other Ambulatory Visit: Payer: Self-pay | Admitting: Internal Medicine

## 2016-07-30 DIAGNOSIS — Z0000 Encounter for general adult medical examination without abnormal findings: Secondary | ICD-10-CM | POA: Diagnosis not present

## 2016-07-30 DIAGNOSIS — Z1231 Encounter for screening mammogram for malignant neoplasm of breast: Secondary | ICD-10-CM | POA: Diagnosis not present

## 2016-07-30 DIAGNOSIS — Z1239 Encounter for other screening for malignant neoplasm of breast: Secondary | ICD-10-CM

## 2016-07-30 DIAGNOSIS — Z23 Encounter for immunization: Secondary | ICD-10-CM | POA: Diagnosis not present

## 2016-12-13 DIAGNOSIS — B9689 Other specified bacterial agents as the cause of diseases classified elsewhere: Secondary | ICD-10-CM | POA: Diagnosis not present

## 2016-12-13 DIAGNOSIS — J208 Acute bronchitis due to other specified organisms: Secondary | ICD-10-CM | POA: Diagnosis not present

## 2016-12-17 DIAGNOSIS — J449 Chronic obstructive pulmonary disease, unspecified: Secondary | ICD-10-CM | POA: Diagnosis not present

## 2016-12-17 DIAGNOSIS — R05 Cough: Secondary | ICD-10-CM | POA: Diagnosis not present

## 2016-12-17 DIAGNOSIS — R0609 Other forms of dyspnea: Secondary | ICD-10-CM | POA: Diagnosis not present

## 2017-01-01 DIAGNOSIS — R202 Paresthesia of skin: Secondary | ICD-10-CM | POA: Diagnosis not present

## 2017-01-01 DIAGNOSIS — J449 Chronic obstructive pulmonary disease, unspecified: Secondary | ICD-10-CM | POA: Diagnosis not present

## 2017-01-01 DIAGNOSIS — R300 Dysuria: Secondary | ICD-10-CM | POA: Diagnosis not present

## 2017-01-01 DIAGNOSIS — Z8639 Personal history of other endocrine, nutritional and metabolic disease: Secondary | ICD-10-CM | POA: Diagnosis not present

## 2017-01-01 DIAGNOSIS — E039 Hypothyroidism, unspecified: Secondary | ICD-10-CM | POA: Diagnosis not present

## 2017-01-01 DIAGNOSIS — E538 Deficiency of other specified B group vitamins: Secondary | ICD-10-CM | POA: Diagnosis not present

## 2017-01-02 DIAGNOSIS — E538 Deficiency of other specified B group vitamins: Secondary | ICD-10-CM | POA: Diagnosis not present

## 2017-01-03 DIAGNOSIS — R05 Cough: Secondary | ICD-10-CM | POA: Diagnosis not present

## 2017-01-08 ENCOUNTER — Other Ambulatory Visit: Payer: Self-pay | Admitting: Internal Medicine

## 2017-01-08 DIAGNOSIS — R918 Other nonspecific abnormal finding of lung field: Secondary | ICD-10-CM

## 2017-01-21 ENCOUNTER — Ambulatory Visit
Admission: RE | Admit: 2017-01-21 | Discharge: 2017-01-21 | Disposition: A | Discharge status: Home / Self Care | Payer: PPO | Source: Ambulatory Visit | Attending: Internal Medicine | Admitting: Internal Medicine

## 2017-01-21 DIAGNOSIS — J439 Emphysema, unspecified: Secondary | ICD-10-CM | POA: Diagnosis not present

## 2017-01-21 DIAGNOSIS — I700 Atherosclerosis of aorta: Secondary | ICD-10-CM | POA: Insufficient documentation

## 2017-01-21 DIAGNOSIS — I2510 Atherosclerotic heart disease of native coronary artery without angina pectoris: Secondary | ICD-10-CM | POA: Insufficient documentation

## 2017-01-21 DIAGNOSIS — R0789 Other chest pain: Secondary | ICD-10-CM | POA: Diagnosis not present

## 2017-01-21 DIAGNOSIS — R918 Other nonspecific abnormal finding of lung field: Secondary | ICD-10-CM | POA: Diagnosis present

## 2017-01-21 DIAGNOSIS — Z9889 Other specified postprocedural states: Secondary | ICD-10-CM | POA: Diagnosis not present

## 2017-02-04 DIAGNOSIS — E538 Deficiency of other specified B group vitamins: Secondary | ICD-10-CM | POA: Diagnosis not present

## 2017-03-07 DIAGNOSIS — E538 Deficiency of other specified B group vitamins: Secondary | ICD-10-CM | POA: Diagnosis not present

## 2017-04-04 DIAGNOSIS — J449 Chronic obstructive pulmonary disease, unspecified: Secondary | ICD-10-CM | POA: Diagnosis not present

## 2017-04-04 DIAGNOSIS — J310 Chronic rhinitis: Secondary | ICD-10-CM | POA: Diagnosis not present

## 2017-04-04 DIAGNOSIS — R05 Cough: Secondary | ICD-10-CM | POA: Diagnosis not present

## 2017-05-09 DIAGNOSIS — J449 Chronic obstructive pulmonary disease, unspecified: Secondary | ICD-10-CM | POA: Diagnosis not present

## 2017-05-09 DIAGNOSIS — E039 Hypothyroidism, unspecified: Secondary | ICD-10-CM | POA: Diagnosis not present

## 2017-05-09 DIAGNOSIS — E538 Deficiency of other specified B group vitamins: Secondary | ICD-10-CM | POA: Diagnosis not present

## 2017-05-09 DIAGNOSIS — Z0000 Encounter for general adult medical examination without abnormal findings: Secondary | ICD-10-CM | POA: Diagnosis not present

## 2017-07-11 DIAGNOSIS — J449 Chronic obstructive pulmonary disease, unspecified: Secondary | ICD-10-CM | POA: Diagnosis not present

## 2017-08-02 DIAGNOSIS — E538 Deficiency of other specified B group vitamins: Secondary | ICD-10-CM | POA: Diagnosis not present

## 2017-08-02 DIAGNOSIS — E039 Hypothyroidism, unspecified: Secondary | ICD-10-CM | POA: Diagnosis not present

## 2017-08-13 DIAGNOSIS — E538 Deficiency of other specified B group vitamins: Secondary | ICD-10-CM | POA: Diagnosis not present

## 2017-08-13 DIAGNOSIS — Z1231 Encounter for screening mammogram for malignant neoplasm of breast: Secondary | ICD-10-CM | POA: Diagnosis not present

## 2017-08-13 DIAGNOSIS — Z23 Encounter for immunization: Secondary | ICD-10-CM | POA: Diagnosis not present

## 2017-08-13 DIAGNOSIS — Z0000 Encounter for general adult medical examination without abnormal findings: Secondary | ICD-10-CM | POA: Diagnosis not present

## 2017-08-13 DIAGNOSIS — Z780 Asymptomatic menopausal state: Secondary | ICD-10-CM | POA: Diagnosis not present

## 2017-08-13 DIAGNOSIS — E039 Hypothyroidism, unspecified: Secondary | ICD-10-CM | POA: Diagnosis not present

## 2017-08-19 DIAGNOSIS — M8588 Other specified disorders of bone density and structure, other site: Secondary | ICD-10-CM | POA: Diagnosis not present

## 2017-08-19 DIAGNOSIS — E538 Deficiency of other specified B group vitamins: Secondary | ICD-10-CM | POA: Diagnosis not present

## 2017-08-19 DIAGNOSIS — Z780 Asymptomatic menopausal state: Secondary | ICD-10-CM | POA: Diagnosis not present

## 2017-09-16 DIAGNOSIS — E039 Hypothyroidism, unspecified: Secondary | ICD-10-CM | POA: Diagnosis not present

## 2017-09-16 DIAGNOSIS — J449 Chronic obstructive pulmonary disease, unspecified: Secondary | ICD-10-CM | POA: Diagnosis not present

## 2017-10-31 IMAGING — CT CT CHEST W/O CM
2 of 3 series · 15 of 36 positions shown, 18 images · non-contrast
Comparison: Chest x-ray 04/20/2013

CLINICAL DATA: Chronic cough. Right chest pain. History of right
breast cancer.

EXAM:
CT CHEST WITHOUT CONTRAST
TECHNIQUE: Multidetector CT imaging of the chest was performed following the
standard protocol without IV contrast.

[Series 2: thorax · axial · 0.68mm/px · z∈[-338,-70]mm · 12 of 158 slices shown, 15 images]
[im 12/158  mediastinal]
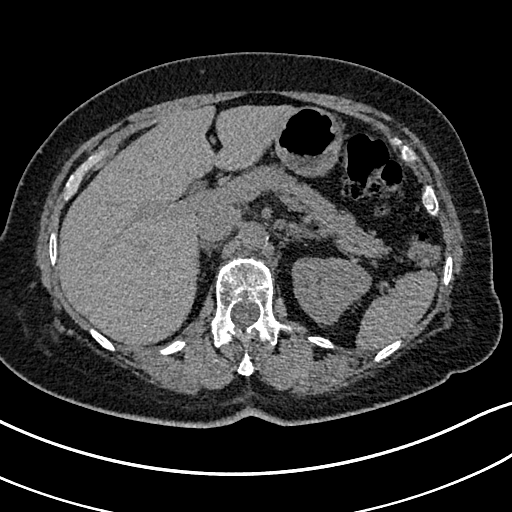
[im 12/158  lung]
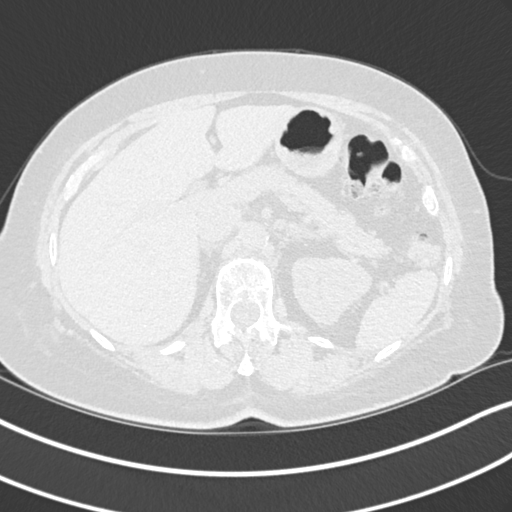
[im 24/158  lung]
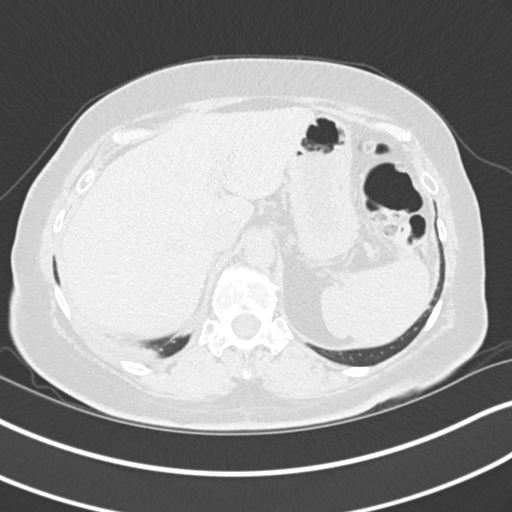
[im 35/158  lung]
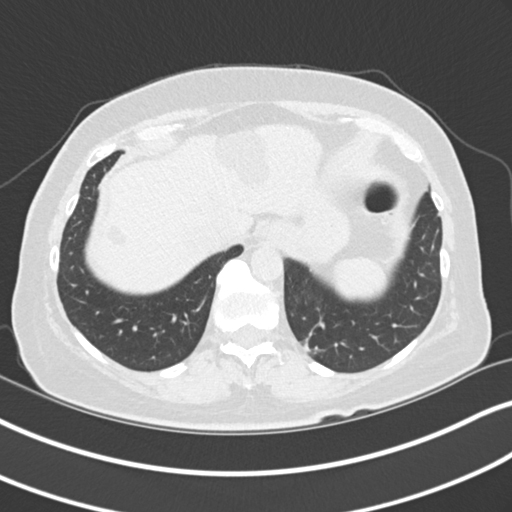
[im 47/158  lung]
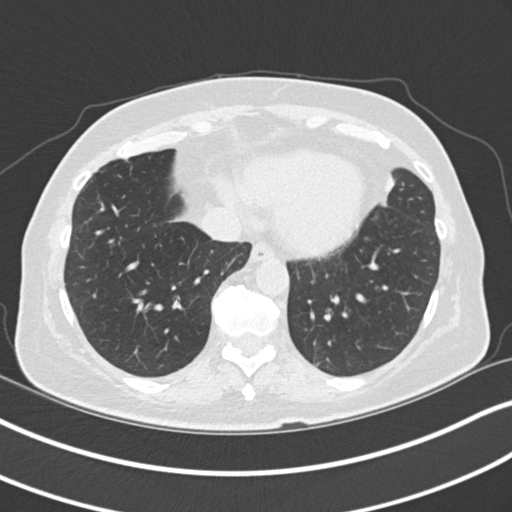
[im 59/158  mediastinal]
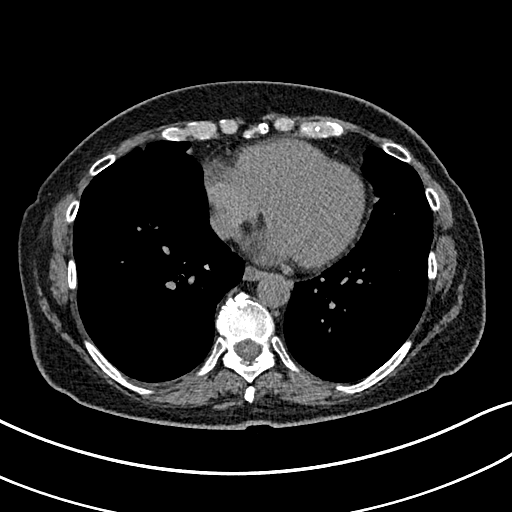
[im 59/158  lung]
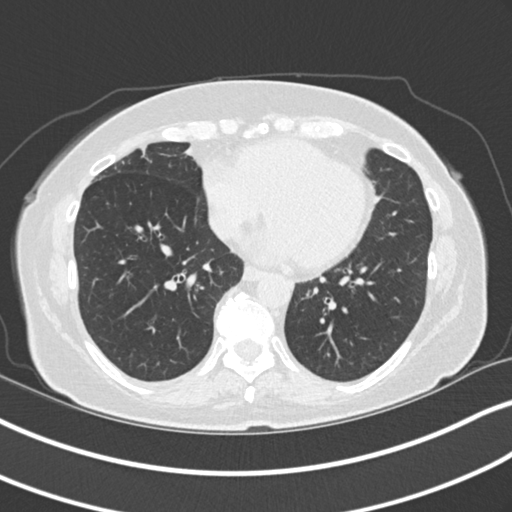
[im 70/158  lung]
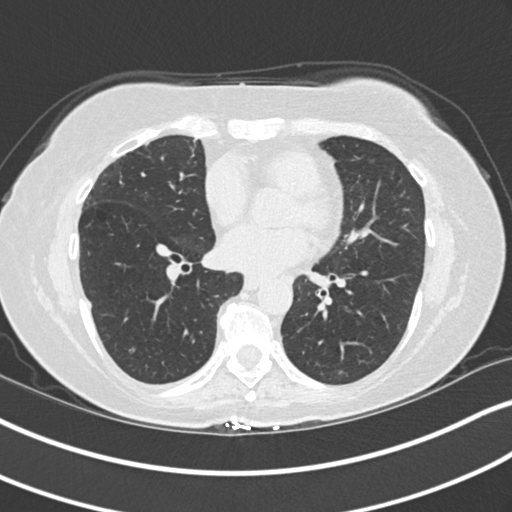
[im 88/158  lung]
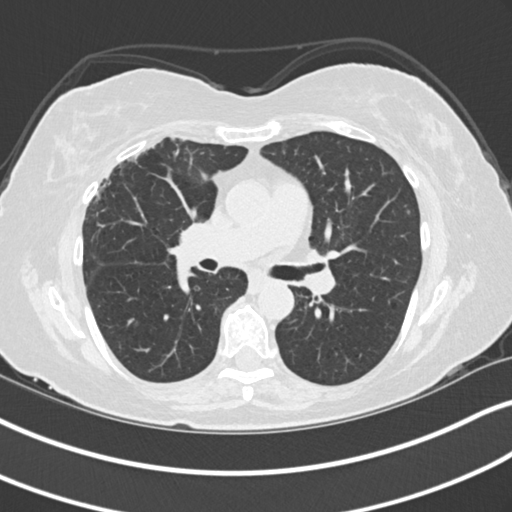
[im 99/158  lung]
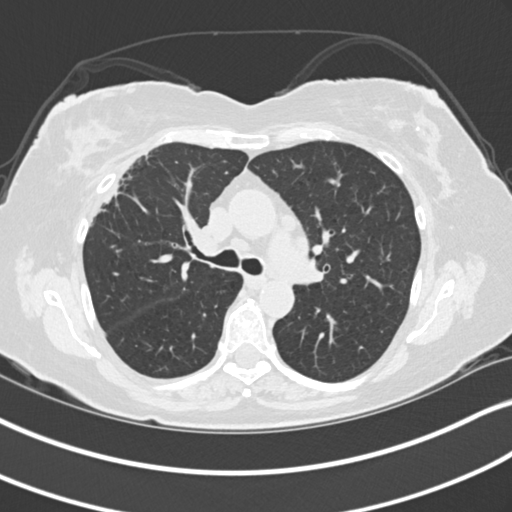
[im 111/158  mediastinal]
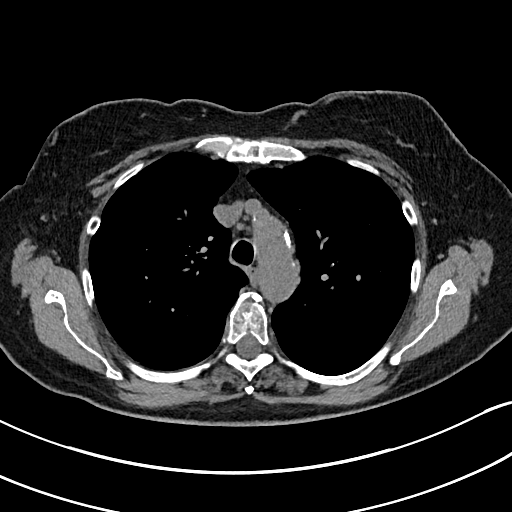
[im 111/158  lung]
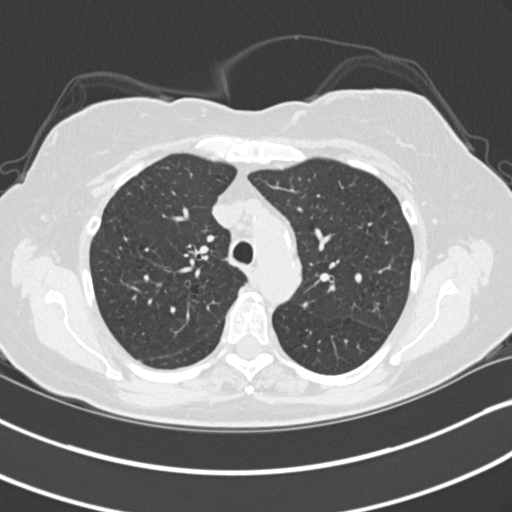
[im 123/158  lung]
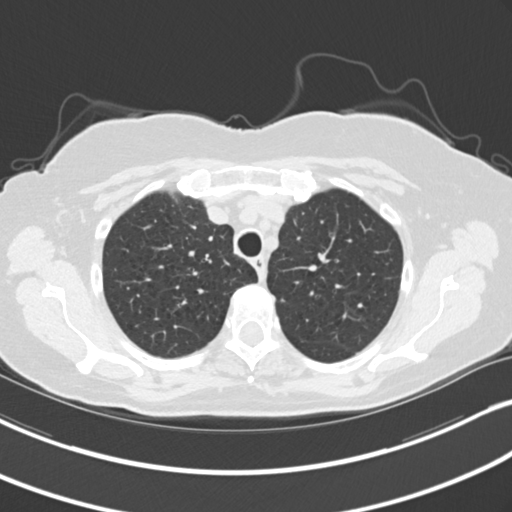
[im 134/158  lung]
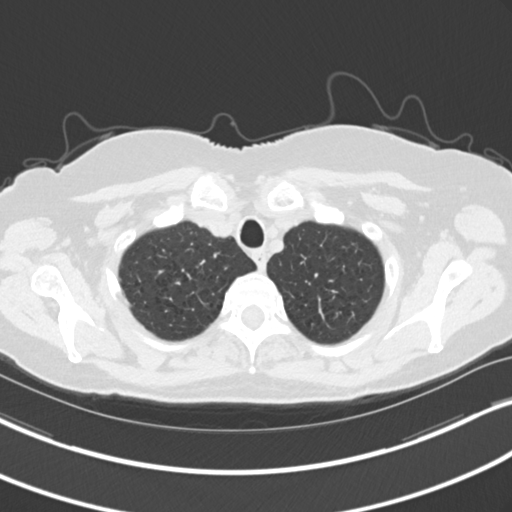
[im 146/158  lung]
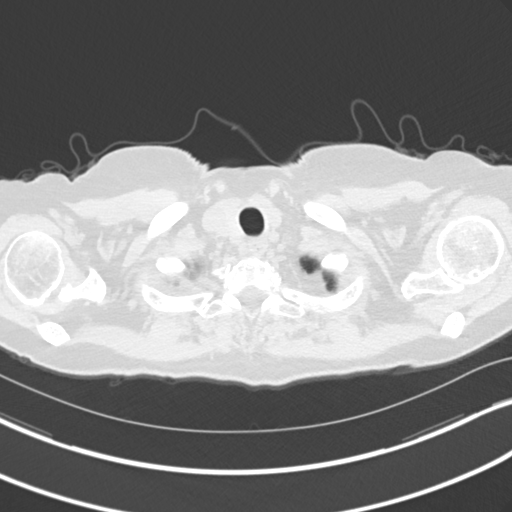

[Series 5: coronal · coronal · 0.63mm/px · 3 of 117 slices shown]
[im 24/117  lung]
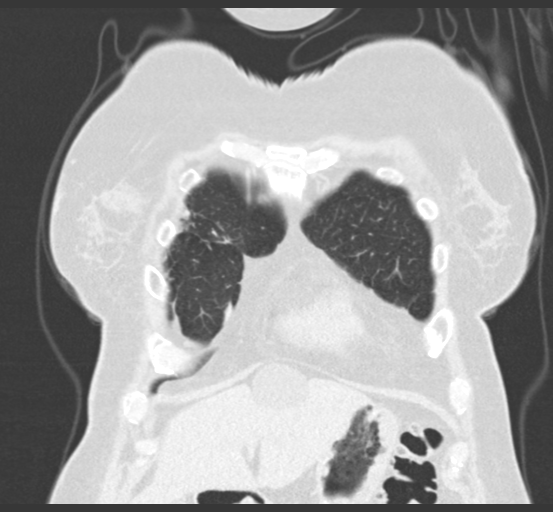
[im 47/117  lung]
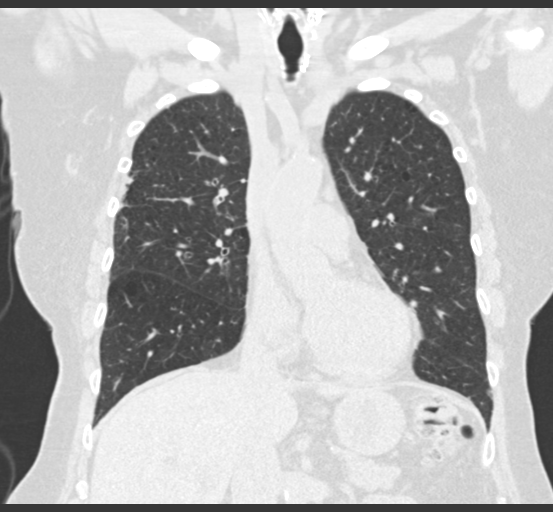
[im 70/117  lung]
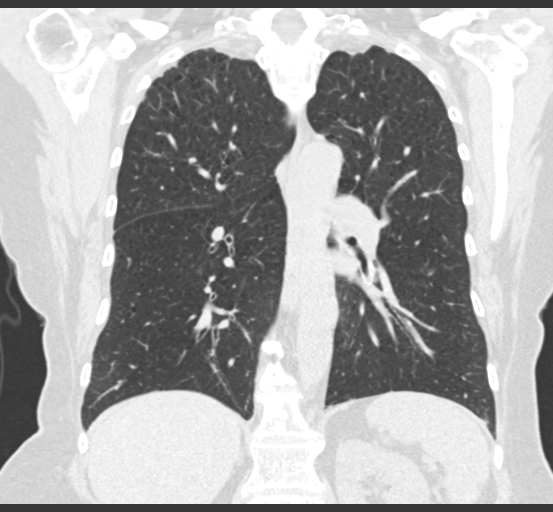

[15 of 36 positions shown; findings below may reference images not displayed]

FINDINGS: Cardiovascular: Moderate calcifications in the aortic arch. No
aneurysm. Heart is normal size. Scattered coronary artery
calcifications in the left anterior descending coronary artery.

Mediastinum/Nodes: Borderline sized mediastinal lymph nodes. AP
window lymph node measures 9 mm. Precarinal lymph node measures 7
mm. No mediastinal, hilar, or axillary adenopathy.

Lungs/Pleura: Biapical scarring. Mild centrilobular emphysema.
Subpleural reticulation noted in the anterior right lung, likely
postradiation changes. Linear scarring in the lung bases. No
effusions. No suspicious pulmonary nodules.

Upper Abdomen: Imaging into the upper abdomen shows no acute
findings. Low-density areas in the liver likely reflects cysts.

Musculoskeletal: Chest wall soft tissues are unremarkable. No acute
bony abnormality.
IMPRESSION: No acute cardiopulmonary disease.

Postradiation changes in the anterior right lung.

Early coronary artery disease.

Aortic Atherosclerosis (05D5W-[id number].N) and Emphysema (05D5W-405.M).

## 2017-11-07 DIAGNOSIS — J449 Chronic obstructive pulmonary disease, unspecified: Secondary | ICD-10-CM | POA: Diagnosis not present

## 2018-02-04 DIAGNOSIS — E039 Hypothyroidism, unspecified: Secondary | ICD-10-CM | POA: Diagnosis not present

## 2018-02-04 DIAGNOSIS — E538 Deficiency of other specified B group vitamins: Secondary | ICD-10-CM | POA: Diagnosis not present

## 2018-02-11 DIAGNOSIS — E039 Hypothyroidism, unspecified: Secondary | ICD-10-CM | POA: Diagnosis not present

## 2018-02-11 DIAGNOSIS — E538 Deficiency of other specified B group vitamins: Secondary | ICD-10-CM | POA: Diagnosis not present

## 2018-03-13 DIAGNOSIS — J449 Chronic obstructive pulmonary disease, unspecified: Secondary | ICD-10-CM | POA: Diagnosis not present

## 2018-03-24 ENCOUNTER — Other Ambulatory Visit: Payer: Self-pay | Admitting: Internal Medicine

## 2018-03-24 DIAGNOSIS — Z1231 Encounter for screening mammogram for malignant neoplasm of breast: Secondary | ICD-10-CM

## 2018-03-31 ENCOUNTER — Ambulatory Visit
Admission: RE | Admit: 2018-03-31 | Discharge: 2018-03-31 | Disposition: A | Discharge status: Home / Self Care | Payer: PPO | Source: Ambulatory Visit | Attending: Internal Medicine | Admitting: Internal Medicine

## 2018-03-31 DIAGNOSIS — Z1231 Encounter for screening mammogram for malignant neoplasm of breast: Secondary | ICD-10-CM | POA: Insufficient documentation

## 2018-03-31 HISTORY — DX: Malignant neoplasm of unspecified site of unspecified female breast: C50.919

## 2018-04-12 ENCOUNTER — Other Ambulatory Visit: Payer: Self-pay

## 2018-04-12 ENCOUNTER — Encounter: Payer: Self-pay | Admitting: Emergency Medicine

## 2018-04-12 ENCOUNTER — Emergency Department
Admission: EM | Admit: 2018-04-12 | Discharge: 2018-04-12 | Disposition: A | Discharge status: Home / Self Care | Payer: PPO | Attending: Emergency Medicine | Admitting: Emergency Medicine

## 2018-04-12 DIAGNOSIS — Z853 Personal history of malignant neoplasm of breast: Secondary | ICD-10-CM | POA: Diagnosis not present

## 2018-04-12 DIAGNOSIS — L299 Pruritus, unspecified: Secondary | ICD-10-CM | POA: Insufficient documentation

## 2018-04-12 MED ORDER — HYDROXYZINE HCL 10 MG PO TABS: 10.0000 mg | ORAL_TABLET | Freq: Three times a day (TID) | ORAL | 0 refills | Status: AC | PRN

## 2018-04-12 MED ORDER — HYDROCORTISONE 1 % EX LOTN: 1.0000 "application " | TOPICAL_LOTION | Freq: Two times a day (BID) | CUTANEOUS | 0 refills | Status: AC

## 2018-04-12 NOTE — ED Triage Notes (Signed)
Multiple insect bites since yesterday.

## 2018-04-12 NOTE — ED Provider Notes (Signed)
[facility] Emergency Department Provider Note  ____________________________________________  Time seen: Approximately 3:45 PM  I have reviewed the triage vital signs and the nursing notes.   HISTORY  Chief Complaint Insect Bite    HPI Kristin Vega is a 72 y.o. female presents to the emergency department with generalized pruritus all over her body.  Patient reports "I can feel them moving on me but you cannot see them".  Patient reports a crawling and moving sensation that causes pruritus.  Patient is currently residing in a senior care facility.  She has not spent significant time outdoors and has not experienced recent travel.  She reports that she has not experienced similar symptoms in the past.  She denies shortness of breath, cough, chest tightness, diarrhea, emesis, syncope or prior history of anaphylaxis.  No alleviating measures have been attempted.  Past Medical History:  Diagnosis Date  . Breast cancer ([place])     There are no active problems to display for this patient.   Past Surgical History:  Procedure Laterality Date  . BREAST BIOPSY Right 2013   core +  . BREAST LUMPECTOMY Right 2013    Prior to Admission medications   Medication Sig Start Date End Date Taking? Authorizing Provider  hydrocortisone 1 % lotion Apply 1 application topically 2 (two) times daily. 04/12/18   Lannie Fields, PA-C  hydrOXYzine (ATARAX/VISTARIL) 10 MG tablet Take 1 tablet (10 mg total) by mouth 3 (three) times daily as needed for up to 5 days. 04/12/18 04/17/18  Lannie Fields, PA-C    Allergies Amoxicillin; Celebrex [celecoxib]; Morphine and related; Onion; Prednisone; Proventil [albuterol]; and Shellfish allergy  Family History  Problem Relation Age of Onset  . Breast cancer Maternal Aunt 70    Social History Social History   Tobacco Use  . Smoking status: Not on file  Substance Use Topics  . Alcohol use: Not on file  . Drug use: Not on file      Review of Systems  Constitutional: Patient has pruritus Eyes: No visual changes. No discharge ENT: No upper respiratory complaints. Cardiovascular: no chest pain. Respiratory: no cough. No SOB. Gastrointestinal: No abdominal pain.  No nausea, no vomiting.  No diarrhea.  No constipation. Genitourinary: Negative for dysuria. No hematuria Musculoskeletal: Negative for musculoskeletal pain. Skin: Negative for rash, abrasions, lacerations, ecchymosis. Neurological: Negative for headaches, focal weakness or numbness.   ____________________________________________   PHYSICAL EXAM:  VITAL SIGNS: ED Triage Vitals  Enc Vitals Group     BP 04/12/18 1437 (!) 163/77     Pulse Rate 04/12/18 1437 79     Resp 04/12/18 1437 18     Temp 04/12/18 1437 98.8 F (37.1 C)     Temp src --      SpO2 04/12/18 1437 96 %     Weight 04/12/18 1438 175 lb (79.4 kg)     Height 04/12/18 1438 5' 5.5" (1.664 m)     Head Circumference --      Peak Flow --      Pain Score 04/12/18 1438 5     Pain Loc --      Pain Edu? --      Excl. in [place]? --      Constitutional: Alert and oriented. Well appearing and in no acute distress. Eyes: Conjunctivae are normal. PERRL. EOMI. Head: Atraumatic. ENT:      Ears: TMs are pearly.      Nose: No congestion/rhinnorhea.  Mouth/Throat: Mucous membranes are moist.   Cardiovascular: Normal rate, regular rhythm. Normal S1 and S2.  Good peripheral circulation. Respiratory: Normal respiratory effort without tachypnea or retractions. Lungs CTAB. Good air entry to the bases with no decreased or absent breath sounds. Gastrointestinal: Bowel sounds 4 quadrants. Soft and nontender to palpation. No guarding or rigidity. No palpable masses. No distention. No CVA tenderness. Skin: No jaundice. Patient has excoriations over upper and lower extremities without urticaria.  There are some scattering regions of erythema consistent with insect  sting/bites.  ____________________________________________   LABS (all labs ordered are listed, but only abnormal results are displayed)  Labs Reviewed - No data to display ____________________________________________  EKG   ____________________________________________  RADIOLOGY  No results found.  ____________________________________________    PROCEDURES  Procedure(s) performed:    Procedures    Medications - No data to display   ____________________________________________   INITIAL IMPRESSION / ASSESSMENT AND PLAN / ED COURSE  Pertinent labs & imaging results that were available during my care of the patient were reviewed by me and considered in my medical decision making (see chart for details).  Review of the [place] CSRS was performed in accordance of the [place] prior to dispensing any controlled drugs.      Assessment and plan Pruritus Patient presents to the emergency department with subjective pruritus all over her body.  Patient was discharged with hydroxyzine and topical hydrocortisone.  She was advised to follow-up with primary care as needed.   ____________________________________________  FINAL CLINICAL IMPRESSION(S) / ED DIAGNOSES  Final diagnoses:  Pruritus      NEW MEDICATIONS STARTED DURING THIS VISIT:  ED Discharge Orders         Ordered    hydrOXYzine (ATARAX/VISTARIL) 10 MG tablet  3 times daily PRN     04/12/18 1537    hydrocortisone 1 % lotion  2 times daily     04/12/18 1537              This chart was dictated using voice recognition software/Dragon. Despite best efforts to proofread, errors can occur which can change the meaning. Any change was purely unintentional.    Lannie Fields, PA-C 04/12/18 1552    Lavonia Drafts, MD 04/12/18 [phone]

## 2018-04-15 DIAGNOSIS — L299 Pruritus, unspecified: Secondary | ICD-10-CM | POA: Diagnosis not present

## 2018-05-12 ENCOUNTER — Other Ambulatory Visit: Payer: Self-pay

## 2018-05-12 ENCOUNTER — Encounter: Payer: Self-pay | Admitting: Intensive Care

## 2018-05-12 ENCOUNTER — Emergency Department: Payer: PPO

## 2018-05-12 ENCOUNTER — Inpatient Hospital Stay
Admission: EM | Admit: 2018-05-12 | Discharge: 2018-05-14 | DRG: 190 | Discharge status: Against Medical Advice | Payer: PPO | Attending: Internal Medicine | Admitting: Internal Medicine

## 2018-05-12 DIAGNOSIS — Z803 Family history of malignant neoplasm of breast: Secondary | ICD-10-CM

## 2018-05-12 DIAGNOSIS — E039 Hypothyroidism, unspecified: Secondary | ICD-10-CM | POA: Diagnosis present

## 2018-05-12 DIAGNOSIS — R0602 Shortness of breath: Secondary | ICD-10-CM | POA: Diagnosis not present

## 2018-05-12 DIAGNOSIS — E785 Hyperlipidemia, unspecified: Secondary | ICD-10-CM | POA: Diagnosis not present

## 2018-05-12 DIAGNOSIS — F17210 Nicotine dependence, cigarettes, uncomplicated: Secondary | ICD-10-CM | POA: Diagnosis present

## 2018-05-12 DIAGNOSIS — Z7982 Long term (current) use of aspirin: Secondary | ICD-10-CM

## 2018-05-12 DIAGNOSIS — Z66 Do not resuscitate: Secondary | ICD-10-CM | POA: Diagnosis present

## 2018-05-12 DIAGNOSIS — Z716 Tobacco abuse counseling: Secondary | ICD-10-CM | POA: Diagnosis not present

## 2018-05-12 DIAGNOSIS — Z853 Personal history of malignant neoplasm of breast: Secondary | ICD-10-CM

## 2018-05-12 DIAGNOSIS — Z7951 Long term (current) use of inhaled steroids: Secondary | ICD-10-CM | POA: Diagnosis not present

## 2018-05-12 DIAGNOSIS — J439 Emphysema, unspecified: Secondary | ICD-10-CM | POA: Diagnosis not present

## 2018-05-12 DIAGNOSIS — Z79890 Hormone replacement therapy (postmenopausal): Secondary | ICD-10-CM

## 2018-05-12 DIAGNOSIS — J441 Chronic obstructive pulmonary disease with (acute) exacerbation: Principal | ICD-10-CM | POA: Diagnosis present

## 2018-05-12 DIAGNOSIS — R0902 Hypoxemia: Secondary | ICD-10-CM

## 2018-05-12 DIAGNOSIS — Z91013 Allergy to seafood: Secondary | ICD-10-CM | POA: Diagnosis not present

## 2018-05-12 DIAGNOSIS — J9601 Acute respiratory failure with hypoxia: Secondary | ICD-10-CM | POA: Diagnosis not present

## 2018-05-12 HISTORY — DX: Tobacco use: Z72.0

## 2018-05-12 HISTORY — DX: Chronic obstructive pulmonary disease, unspecified: J44.9

## 2018-05-12 LAB — BASIC METABOLIC PANEL
Anion gap: 10 (ref 5–15)
BUN: 8 mg/dL (ref 8–23)
CALCIUM: 8.6 mg/dL — AB (ref 8.9–10.3)
CO2: 32 mmol/L (ref 22–32)
Chloride: 97 mmol/L — ABNORMAL LOW (ref 98–111)
Creatinine, Ser: 0.61 mg/dL (ref 0.44–1.00)
GLUCOSE: 102 mg/dL — AB (ref 70–99)
Potassium: 4 mmol/L (ref 3.5–5.1)
Sodium: 139 mmol/L (ref 135–145)

## 2018-05-12 LAB — TROPONIN I

## 2018-05-12 LAB — CBC
HCT: 43.5 % (ref 36.0–46.0)
Hemoglobin: 13.7 g/dL (ref 12.0–15.0)
MCH: 30.6 pg (ref 26.0–34.0)
MCHC: 31.5 g/dL (ref 30.0–36.0)
MCV: 97.1 fL (ref 80.0–100.0)
NRBC: 0 % (ref 0.0–0.2)
PLATELETS: 204 10*3/uL (ref 150–400)
RBC: 4.48 MIL/uL (ref 3.87–5.11)
RDW: 14.8 % (ref 11.5–15.5)
WBC: 7.7 10*3/uL (ref 4.0–10.5)

## 2018-05-12 LAB — URINALYSIS, COMPLETE (UACMP) WITH MICROSCOPIC
Bacteria, UA: NONE SEEN
Bilirubin Urine: NEGATIVE
GLUCOSE, UA: NEGATIVE mg/dL
Hgb urine dipstick: NEGATIVE
KETONES UR: NEGATIVE mg/dL
LEUKOCYTES UA: NEGATIVE
Nitrite: NEGATIVE
PROTEIN: NEGATIVE mg/dL
Specific Gravity, Urine: 1.005 (ref 1.005–1.030)
pH: 6 (ref 5.0–8.0)

## 2018-05-12 LAB — INFLUENZA PANEL BY PCR (TYPE A & B)
Influenza A By PCR: NEGATIVE
Influenza B By PCR: NEGATIVE

## 2018-05-12 MED ORDER — SODIUM CHLORIDE 0.9 % IV BOLUS
500.0000 mL | Freq: Once | INTRAVENOUS | Status: AC
  Administered 2018-05-12: 500 mL via INTRAVENOUS

## 2018-05-12 MED ORDER — ACETAMINOPHEN 325 MG PO TABS: 650.0000 mg | ORAL_TABLET | Freq: Four times a day (QID) | ORAL | Status: DC | PRN

## 2018-05-12 MED ORDER — METHYLPREDNISOLONE SODIUM SUCC 125 MG IJ SOLR
125.0000 mg | Freq: Once | INTRAMUSCULAR | Status: AC
  Administered 2018-05-12: 125 mg via INTRAVENOUS
  Filled 2018-05-12: qty 2

## 2018-05-12 MED ORDER — ENOXAPARIN SODIUM 40 MG/0.4ML ~~LOC~~ SOLN
40.0000 mg | SUBCUTANEOUS | Status: DC
  Administered 2018-05-12 – 2018-05-13 (×2): 40 mg via SUBCUTANEOUS
  Filled 2018-05-12: qty 0.4

## 2018-05-12 MED ORDER — ALBUTEROL SULFATE (2.5 MG/3ML) 0.083% IN NEBU
INHALATION_SOLUTION | RESPIRATORY_TRACT | Status: AC
  Filled 2018-05-12: qty 3

## 2018-05-12 MED ORDER — LEVOTHYROXINE SODIUM 50 MCG PO TABS
75.0000 ug | ORAL_TABLET | Freq: Every day | ORAL | Status: DC
  Filled 2018-05-12: qty 1

## 2018-05-12 MED ORDER — IPRATROPIUM-ALBUTEROL 0.5-2.5 (3) MG/3ML IN SOLN
3.0000 mL | Freq: Four times a day (QID) | RESPIRATORY_TRACT | Status: DC
  Administered 2018-05-12 – 2018-05-14 (×7): 3 mL via RESPIRATORY_TRACT
  Filled 2018-05-12 (×6): qty 3

## 2018-05-12 MED ORDER — ONDANSETRON HCL 4 MG PO TABS: 4.0000 mg | ORAL_TABLET | Freq: Four times a day (QID) | ORAL | Status: DC | PRN

## 2018-05-12 MED ORDER — IPRATROPIUM-ALBUTEROL 0.5-2.5 (3) MG/3ML IN SOLN
3.0000 mL | Freq: Once | RESPIRATORY_TRACT | Status: AC
  Administered 2018-05-12: 3 mL via RESPIRATORY_TRACT
  Filled 2018-05-12: qty 6

## 2018-05-12 MED ORDER — ONDANSETRON HCL 4 MG/2ML IJ SOLN: 4.0000 mg | Freq: Four times a day (QID) | INTRAMUSCULAR | Status: DC | PRN

## 2018-05-12 MED ORDER — ALBUTEROL SULFATE (2.5 MG/3ML) 0.083% IN NEBU: 2.5000 mg | INHALATION_SOLUTION | RESPIRATORY_TRACT | Status: DC | PRN

## 2018-05-12 MED ORDER — ASPIRIN EC 325 MG PO TBEC: 325.0000 mg | DELAYED_RELEASE_TABLET | Freq: Every day | ORAL | Status: DC | PRN

## 2018-05-12 MED ORDER — ENOXAPARIN SODIUM 40 MG/0.4ML ~~LOC~~ SOLN
SUBCUTANEOUS | Status: AC
  Administered 2018-05-12: 40 mg via SUBCUTANEOUS
  Filled 2018-05-12: qty 0.4

## 2018-05-12 MED ORDER — IPRATROPIUM-ALBUTEROL 0.5-2.5 (3) MG/3ML IN SOLN
3.0000 mL | Freq: Once | RESPIRATORY_TRACT | Status: AC
  Administered 2018-05-12: 3 mL via RESPIRATORY_TRACT

## 2018-05-12 MED ORDER — MOMETASONE FURO-FORMOTEROL FUM 100-5 MCG/ACT IN AERO
2.0000 | INHALATION_SPRAY | Freq: Two times a day (BID) | RESPIRATORY_TRACT | Status: DC
  Administered 2018-05-13 (×2): 2 via RESPIRATORY_TRACT
  Filled 2018-05-12 (×2): qty 8.8

## 2018-05-12 MED ORDER — IPRATROPIUM-ALBUTEROL 0.5-2.5 (3) MG/3ML IN SOLN
RESPIRATORY_TRACT | Status: AC
  Administered 2018-05-12: 3 mL
  Filled 2018-05-12: qty 3

## 2018-05-12 MED ORDER — POLYETHYLENE GLYCOL 3350 17 G PO PACK: 17.0000 g | PACK | Freq: Every day | ORAL | Status: DC | PRN

## 2018-05-12 MED ORDER — METHYLPREDNISOLONE SODIUM SUCC 125 MG IJ SOLR
60.0000 mg | Freq: Two times a day (BID) | INTRAMUSCULAR | Status: DC
  Administered 2018-05-12 – 2018-05-13 (×3): 60 mg via INTRAVENOUS
  Filled 2018-05-12 (×4): qty 2

## 2018-05-12 MED ORDER — ACETAMINOPHEN 650 MG RE SUPP: 650.0000 mg | Freq: Four times a day (QID) | RECTAL | Status: DC | PRN

## 2018-05-12 NOTE — Progress Notes (Signed)
Advance care planning  Purpose of Encounter COPD and CODE STATUS discussion  Parties in Attendance Patient  Patients Decisional capacity Patient is alert and oriented.  Able to make medical decisions.  Patient does not have a documented healthcare power of attorney or advanced directives in place.  Discussed with patient regarding her COPD exacerbation and this being a progressively worsening disease over time.  Patient understands.  Treatment and prognosis discussed.  All questions answered  Discussed CODE STATUS.  Patient wants to be DO NOT RESUSCITATE and DO NOT INTUBATE.  Orders entered.  Time spent - 17 minutes

## 2018-05-12 NOTE — ED Provider Notes (Signed)
[facility] Emergency Department Provider Note ____________________________________________   First MD Initiated Contact with Patient 05/12/18 1710     (approximate)  I have reviewed the triage vital signs and the nursing notes.   HISTORY  Chief Complaint Shortness of Breath    HPI Kristin Vega is a 72 y.o. female with PMH as noted below as well as a history of COPD who presents with multiple complaints, but primarily shortness of breath which is worsened over the last few weeks, and which she feels is exacerbated by flies or other bugs that she is caught in the place that she is staying.  She also reports generalized fatigue, sore throat, and cough.  She states she has taken her Symbicort and Advair inhalers with no relief.  Past Medical History:  Diagnosis Date  . Breast cancer ([place])   . Breast cancer ([place])     There are no active problems to display for this patient.   Past Surgical History:  Procedure Laterality Date  . BREAST BIOPSY Right 2013   core +  . BREAST LUMPECTOMY Right 2013  . BREAST LUMPECTOMY Right     Prior to Admission medications   Medication Sig Start Date End Date Taking? Authorizing Provider  ADVAIR DISKUS 100-50 MCG/DOSE AEPB Inhale 1 puff into the lungs 2 (two) times daily. 04/07/18  Yes [provider]  aspirin 325 MG tablet Take 325 mg by mouth daily as needed for mild pain.   Yes [provider]  levothyroxine (SYNTHROID, LEVOTHROID) 75 MCG tablet Take 75 mcg by mouth daily. 04/22/18  Yes [provider]  hydrocortisone 1 % lotion Apply 1 application topically 2 (two) times daily. 04/12/18   Lannie Fields, PA-C    Allergies Amoxicillin; Celebrex [celecoxib]; Morphine and related; Onion; Prednisone; Proventil [albuterol]; and Shellfish allergy  Family History  Problem Relation Age of Onset  . Breast cancer Maternal Aunt 70    Social History Social History   Tobacco Use  .  Smoking status: Current Every Day Smoker    Types: Cigarettes  . Smokeless tobacco: Never Used  Substance Use Topics  . Alcohol use: Yes    Frequency: Never    Comment: occ  . Drug use: Never    Review of Systems  Constitutional: No fever.  Positive for fatigue. Eyes: No redness. ENT: Positive for sore throat. Cardiovascular: Denies chest pain. Respiratory: Positive for shortness of breath. Gastrointestinal: No vomiting or diarrhea.  Genitourinary: Negative for dysuria.  Musculoskeletal: Negative for back pain. Skin: Negative for rash. Neurological: Positive for headache.   ____________________________________________   PHYSICAL EXAM:  VITAL SIGNS: ED Triage Vitals  Enc Vitals Group     BP 05/12/18 1023 (!) 162/72     Pulse Rate 05/12/18 1023 86     Resp 05/12/18 1023 (!) 24     Temp 05/12/18 1023 97.7 F (36.5 C)     Temp Source 05/12/18 1023 Oral     SpO2 05/12/18 1023 (!) 88 %     Weight 05/12/18 1024 172 lb (78 kg)     Height 05/12/18 1024 5' 5.5" (1.664 m)     Head Circumference --      Peak Flow --      Pain Score 05/12/18 1024 7     Pain Loc --      Pain Edu? --      Excl. in [place]? --     Constitutional: Alert and oriented.  Relatively well appearing and  in no acute distress. Eyes: Conjunctivae are normal.  Head: Atraumatic. Nose: No congestion/rhinnorhea. Mouth/Throat: Mucous membranes are moist.  Oropharynx clear with no erythema or exudate. Neck: Normal range of motion.  Cardiovascular: Normal rate, regular rhythm. Grossly normal heart sounds.  Good peripheral circulation. Respiratory: Slightly increased respiratory effort.  No retractions.  Bilateral diffuse wheezing. Gastrointestinal:  No distention.  Musculoskeletal:  Extremities warm and well perfused.  Neurologic:  Normal speech and language. No gross focal neurologic deficits are appreciated.  Skin:  Skin is warm and dry. No rash noted. Psychiatric: Mood and affect are normal. Speech and  behavior are normal.  ____________________________________________   LABS (all labs ordered are listed, but only abnormal results are displayed)  Labs Reviewed  BASIC METABOLIC PANEL - Abnormal; Notable for the following components:      Result Value   Chloride 97 (*)    Glucose, Bld 102 (*)    Calcium 8.6 (*)    All other components within normal limits  URINALYSIS, COMPLETE (UACMP) WITH MICROSCOPIC - Abnormal; Notable for the following components:   Color, Urine YELLOW (*)    APPearance CLEAR (*)    All other components within normal limits  CBC  TROPONIN I  INFLUENZA PANEL BY PCR (TYPE A & B)   ____________________________________________  EKG  ED ECG REPORT I, Arta Silence, the attending physician, personally viewed and interpreted this ECG.  Date: 05/12/2018 EKG Time: 1023 Rate: 84 Rhythm: normal sinus rhythm QRS Axis: Right axis deviation Intervals: normal ST/T Wave abnormalities: normal Narrative Interpretation: no evidence of acute ischemia  ____________________________________________  RADIOLOGY  CXR: Emphysematous changes with no focal infiltrate  ____________________________________________   PROCEDURES  Procedure(s) performed: No  Procedures  Critical Care performed: No ____________________________________________   INITIAL IMPRESSION / ASSESSMENT AND PLAN / ED COURSE  Pertinent labs & imaging results that were available during my care of the patient were reviewed by me and considered in my medical decision making (see chart for details).  71 year old female with history of COPD presents with worsening shortness of breath, generalized fatigue, sore throat, and cough over the last few weeks.  The patient is primarily concerned that it could be coming from bugs in her home.  The patient presents some adhesive strips on which she is caught numerous small insects which appear similar to flies.  I reviewed the past medical records in Epic;  the patient was seen here exactly a month ago complaining of pruritus and was discharged at that time.  She had no respiratory symptoms during that visit.  On exam today, the vital signs are normal except for her O2 saturation which was 88% in triage, but by the time of my evaluation was 85% on room air.  She has diffuse wheezing bilaterally.  The remainder of the exam is as described above.  Chest x-ray shows no focal infiltrate.  The lab work-up is reassuring.  Overall the presentation is most consistent with COPD exacerbation, which I explained to the patient certainly could be exacerbated by irritants in her environment.  There is no evidence of pneumonia, but I will check the patient for influenza.  Overall given the hypoxia the patient will likely require admission.  I will initiate nebulized treatments and steroid.  The patient has a listed allergy to Proventil and to prednisone; I asked her about this, and the patient stated that she thinks she felt shaky but otherwise does not know what the reaction is.  She denies ever having had a reaction  of hives, throat swelling or closing, or other anaphylactic type symptoms to any of these medicines.  I suspect that the "allergy" she describes is actually due to normal side effects.  We will give DuoNeb and Solu-Medrol and closely watch the patient.  ----------------------------------------- 7:26 PM on 05/12/2018 -----------------------------------------   No adverse reaction to DuoNeb's or Solu-Medrol.  The patient reports some improvement of her symptoms but is still hypoxic on room air.  She will require admission.  I signed her out to the hospitalist Dr. Darvin Neighbours. ____________________________________________   FINAL CLINICAL IMPRESSION(S) / ED DIAGNOSES  Final diagnoses:  COPD exacerbation ([place])  Hypoxia      NEW MEDICATIONS STARTED DURING THIS VISIT:  New Prescriptions   No medications on file     Note:  This document was prepared  using Dragon voice recognition software and may include unintentional dictation errors.   Arta Silence, MD 05/12/18 [phone]

## 2018-05-12 NOTE — ED Notes (Signed)
Patient transported to X-ray 

## 2018-05-12 NOTE — ED Triage Notes (Signed)
Patient c/o SOB X 10 days. Denies oxygen use at home. Reported using inhalers at home and z-pac with no relief. O2 sats 88-91% on RA.

## 2018-05-12 NOTE — ED Notes (Signed)
Patient placed on 2L O2 

## 2018-05-12 NOTE — ED Notes (Signed)
ER currently out of flu swabs.This RN called lab about flu swabs. Lab verified that they would send down flu swabs shortly.

## 2018-05-12 NOTE — H&P (Signed)
[place] at [place] NAME: Kristin Vega    MR#:  [id number]  DATE OF BIRTH:  1945/09/07  DATE OF ADMISSION:  05/12/2018  PRIMARY CARE PHYSICIAN: Glendon Axe, MD   REQUESTING/REFERRING PHYSICIAN: Dr. Cherylann Banas  CHIEF COMPLAINT:   Chief Complaint  Patient presents with  . Shortness of Breath    HISTORY OF PRESENT ILLNESS:  Kristin Vega  is a 72 y.o. female with a known history of COPD, tobacco use, allergies, hyperlipidemia presents to the emergency room complaining of worsening shortness of breath and wheezing of 1 week.  Patient had a Z-Pak from a prior prescription which she used without improvement.  Has been using her inhalers to no benefit.  No fever. Patient continues to smoke in spite of her breathing issues.  Today she was found to have saturations of 85% on room air and placed on 2 L oxygen.  Has received multiple rounds of nebulizers and a steroid dose with no significant improvement. Sees Dr. Raul Del of pulmonary as outpatient.  PAST MEDICAL HISTORY:   Past Medical History:  Diagnosis Date  . Breast cancer ([place])   . Breast cancer ([place])   . COPD (chronic obstructive pulmonary disease) ([place])   . Tobacco use     PAST SURGICAL HISTORY:   Past Surgical History:  Procedure Laterality Date  . BREAST BIOPSY Right 2013   core +  . BREAST LUMPECTOMY Right 2013  . BREAST LUMPECTOMY Right     SOCIAL HISTORY:   Social History   Tobacco Use  . Smoking status: Current Every Day Smoker    Types: Cigarettes  . Smokeless tobacco: Never Used  Substance Use Topics  . Alcohol use: Yes    Frequency: Never    Comment: occ    FAMILY HISTORY:   Family History  Problem Relation Age of Onset  . Breast cancer Maternal Aunt 70    DRUG ALLERGIES:   Allergies  Allergen Reactions  . Amoxicillin   . Celebrex [Celecoxib] Hives  . Morphine And Related Nausea And Vomiting  . Onion Swelling  . Prednisone Other (See  Comments)    shaky  . Shellfish Allergy Nausea And Vomiting    REVIEW OF SYSTEMS:   Review of Systems  Constitutional: Positive for chills and malaise/fatigue. Negative for fever and weight loss.  HENT: Negative for hearing loss and nosebleeds.   Eyes: Negative for blurred vision, double vision and pain.  Respiratory: Positive for cough, sputum production, shortness of breath and wheezing. Negative for hemoptysis.   Cardiovascular: Negative for chest pain, palpitations, orthopnea and leg swelling.  Gastrointestinal: Negative for abdominal pain, constipation, diarrhea, nausea and vomiting.  Genitourinary: Negative for dysuria and hematuria.  Musculoskeletal: Negative for back pain, falls and myalgias.  Skin: Negative for rash.  Neurological: Negative for dizziness, tremors, sensory change, speech change, focal weakness, seizures and headaches.  Endo/Heme/Allergies: Does not bruise/bleed easily.  Psychiatric/Behavioral: Negative for depression and memory loss. The patient is not nervous/anxious.     MEDICATIONS AT HOME:   Prior to Admission medications   Medication Sig Start Date End Date Taking? Authorizing Provider  ADVAIR DISKUS 100-50 MCG/DOSE AEPB Inhale 1 puff into the lungs 2 (two) times daily. 04/07/18  Yes [provider]  aspirin 325 MG tablet Take 325 mg by mouth daily as needed for mild pain.   Yes [provider]  levothyroxine (SYNTHROID, LEVOTHROID) 75 MCG tablet Take 75 mcg by mouth daily. 04/22/18  Yes [provider]  hydrocortisone 1 % lotion Apply 1 application topically 2 (two) times daily. 04/12/18   Lannie Fields, PA-C     VITAL SIGNS:  Blood pressure (!) 143/58, pulse 84, temperature 97.9 F (36.6 C), temperature source Oral, resp. rate 20, height 5' 5.5" (1.664 m), weight 78 kg, SpO2 94 %.  PHYSICAL EXAMINATION:  Physical Exam  GENERAL:  72 y.o.-year-old patient lying in the bed with conversational dyspnea EYES: Pupils equal,  round, reactive to light and accommodation. No scleral icterus. Extraocular muscles intact.  HEENT: Head atraumatic, normocephalic. Oropharynx and nasopharynx clear. No oropharyngeal erythema, moist oral mucosa  NECK:  Supple, no jugular venous distention. No thyroid enlargement, no tenderness.  LUNGS: Decreased air entry with bilateral wheezing CARDIOVASCULAR: S1, S2 normal. No murmurs, rubs, or gallops.  ABDOMEN: Soft, nontender, nondistended. Bowel sounds present. No organomegaly or mass.  EXTREMITIES: No pedal edema, cyanosis, or clubbing. + 2 pedal & radial pulses b/l.   NEUROLOGIC: Cranial nerves II through XII are intact. No focal Motor or sensory deficits appreciated b/l PSYCHIATRIC: The patient is alert and oriented x 3. Good affect.  SKIN: No obvious rash, lesion, or ulcer.   LABORATORY PANEL:   CBC Recent Labs  Lab 05/12/18 1041  WBC 7.7  HGB 13.7  HCT 43.5  PLT 204   ------------------------------------------------------------------------------------------------------------------  Chemistries  Recent Labs  Lab 05/12/18 1041  NA 139  K 4.0  CL 97*  CO2 32  GLUCOSE 102*  BUN 8  CREATININE 0.61  CALCIUM 8.6*   ------------------------------------------------------------------------------------------------------------------  Cardiac Enzymes Recent Labs  Lab 05/12/18 1041  TROPONINI <0.03   ------------------------------------------------------------------------------------------------------------------  RADIOLOGY:  Dg Chest 2 View  Result Date: 05/12/2018 CLINICAL DATA:  72 year old female with a history of shortness of breath. EXAM: CHEST - 2 VIEW COMPARISON:  04/20/2013 FINDINGS: Cardiomediastinal silhouette unchanged in size and contour. Increased pattern of reticular opacities compared to the prior plain film. Stigmata of emphysema, with increased retrosternal airspace, flattened hemidiaphragms, increased AP diameter, and hyperinflation on the AP view.  No confluent airspace disease.  No pneumothorax or pleural effusion. No displaced fracture. IMPRESSION: Evidence of worsening chronic lung changes and emphysema without superimposed acute cardiopulmonary disease. Electronically Signed   By: Corrie Mckusick D.O.   On: 05/12/2018 11:03     IMPRESSION AND PLAN:   *Acute COPD exacerbation with acute hypoxic respiratory failure Influenza checked and is negative -IV steroids - Scheduled Nebulizers - Inhalers -Wean O2 as tolerated - Consult pulmonary if no improvement  *Tobacco abuse.  Counseled to quit smoking for greater than 3 minutes.  *DVT prophylaxis with Lovenox  All the records are reviewed and case discussed with ED provider. Management plans discussed with the patient, family and they are in agreement.  CODE STATUS: DO NOT RESUSCITATE  TOTAL TIME TAKING CARE OF THIS PATIENT: 40 minutes.   Leia Alf Britne Borelli M.D on 05/12/2018 at 7:40 PM  Between 7am to 6pm - Pager - [phone]  After 6pm go to [email or web address] - password [email or web address] [facility]  Office  [phone]  CC: Primary care physician; Glendon Axe, MD  Note: This dictation was prepared with Dragon dictation along with smaller phrase technology. Any transcriptional errors that result from this process are unintentional.

## 2018-05-12 NOTE — ED Notes (Signed)
O2 off to check RA sat

## 2018-05-12 NOTE — ED Notes (Signed)
In [place] awaiting room.  No distress.  Drinking some water.

## 2018-05-13 ENCOUNTER — Other Ambulatory Visit: Payer: Self-pay

## 2018-05-13 LAB — CBC
HCT: 43.7 % (ref 36.0–46.0)
HEMOGLOBIN: 13.8 g/dL (ref 12.0–15.0)
MCH: 30.7 pg (ref 26.0–34.0)
MCHC: 31.6 g/dL (ref 30.0–36.0)
MCV: 97.1 fL (ref 80.0–100.0)
NRBC: 0 % (ref 0.0–0.2)
PLATELETS: 212 10*3/uL (ref 150–400)
RBC: 4.5 MIL/uL (ref 3.87–5.11)
RDW: 14.6 % (ref 11.5–15.5)
WBC: 6.1 10*3/uL (ref 4.0–10.5)

## 2018-05-13 LAB — BASIC METABOLIC PANEL
Anion gap: 7 (ref 5–15)
BUN: 9 mg/dL (ref 8–23)
CHLORIDE: 100 mmol/L (ref 98–111)
CO2: 31 mmol/L (ref 22–32)
CREATININE: 0.5 mg/dL (ref 0.44–1.00)
Calcium: 8.7 mg/dL — ABNORMAL LOW (ref 8.9–10.3)
GFR calc Af Amer: >60 mL/min (ref >60)
GFR calc non Af Amer: >60 mL/min (ref >60)
GLUCOSE: 173 mg/dL — AB (ref 70–99)
POTASSIUM: 4.8 mmol/L (ref 3.5–5.1)
Sodium: 138 mmol/L (ref 135–145)

## 2018-05-13 MED ORDER — AZITHROMYCIN 500 MG PO TABS
250.0000 mg | ORAL_TABLET | Freq: Every day | ORAL | Status: DC
  Filled 2018-05-13: qty 1

## 2018-05-13 MED ORDER — AZITHROMYCIN 500 MG PO TABS
500.0000 mg | ORAL_TABLET | Freq: Every day | ORAL | Status: AC
  Administered 2018-05-13: 500 mg via ORAL
  Filled 2018-05-13: qty 1

## 2018-05-13 NOTE — Progress Notes (Signed)
[place] at [place] NAME: Kristin Vega    MR#:  [id number]  DATE OF BIRTH:  1945-11-04  SUBJECTIVE: Admitted for COPD exacerbation, possible allergic reaction caused by house infested with bugs.  CHIEF COMPLAINT:   Chief Complaint  Patient presents with  . Shortness of Breath    REVIEW OF SYSTEMS:   ROS CONSTITUTIONAL: No fever, fatigue or weakness.  Hoarseness of voice. EYES: No blurred or double vision.  EARS, NOSE, AND THROAT: No tinnitus or ear pain.  RESPIRATORY: Some shortness of breath.  Wheezing or hemoptysis.  CARDIOVASCULAR: No chest pain, orthopnea, edema.  GASTROINTESTINAL: No nausea, vomiting, diarrhea or abdominal pain.  GENITOURINARY: No dysuria, hematuria.  ENDOCRINE: No polyuria, nocturia,  HEMATOLOGY: No anemia, easy bruising or bleeding SKIN: No rash or lesion. MUSCULOSKELETAL: No joint pain or arthritis.   NEUROLOGIC: No tingling, numbness, weakness.  PSYCHIATRY: No anxiety or depression.   DRUG ALLERGIES:   Allergies  Allergen Reactions  . Amoxicillin   . Celebrex [Celecoxib] Hives  . Morphine And Related Nausea And Vomiting  . Onion Swelling  . Prednisone Other (See Comments)    shaky  . Shellfish Allergy Nausea And Vomiting    VITALS:  Blood pressure (!) 130/51, pulse 77, temperature 98.1 F (36.7 C), temperature source Oral, resp. rate 18, height 5' 5.5" (1.664 m), weight 78 kg, SpO2 95 %.  PHYSICAL EXAMINATION:  GENERAL:  72 y.o.-year-old patient lying in the bed with no acute distress.  EYES: Pupils equal, round, reactive to light and accommodation. No scleral icterus. Extraocular muscles intact.  HEENT: Head atraumatic, normocephalic. Oropharynx and nasopharynx clear.  NECK:  Supple, no jugular venous distention. No thyroid enlargement, no tenderness.  LUNGS: coarse breath sounds bilaterally. CARDIOVASCULAR: S1, S2 normal. No murmurs, rubs, or gallops.  ABDOMEN: Soft, nontender,  nondistended. Bowel sounds present. No organomegaly or mass.  EXTREMITIES: No pedal edema, cyanosis, or clubbing.  NEUROLOGIC: Cranial nerves II through XII are intact. Muscle strength 5/5 in all extremities. Sensation intact. Gait not checked.  PSYCHIATRIC: The patient is alert and oriented x 3.  SKIN: No obvious rash, lesion, or ulcer.    LABORATORY PANEL:   CBC Recent Labs  Lab 05/13/18 0449  WBC 6.1  HGB 13.8  HCT 43.7  PLT 212   ------------------------------------------------------------------------------------------------------------------  Chemistries  Recent Labs  Lab 05/13/18 0449  NA 138  K 4.8  CL 100  CO2 31  GLUCOSE 173*  BUN 9  CREATININE 0.50  CALCIUM 8.7*   ------------------------------------------------------------------------------------------------------------------  Cardiac Enzymes Recent Labs  Lab 05/12/18 1041  TROPONINI <0.03   ------------------------------------------------------------------------------------------------------------------  RADIOLOGY:  Dg Chest 2 View  Result Date: 05/12/2018 CLINICAL DATA:  72 year old female with a history of shortness of breath. EXAM: CHEST - 2 VIEW COMPARISON:  04/20/2013 FINDINGS: Cardiomediastinal silhouette unchanged in size and contour. Increased pattern of reticular opacities compared to the prior plain film. Stigmata of emphysema, with increased retrosternal airspace, flattened hemidiaphragms, increased AP diameter, and hyperinflation on the AP view. No confluent airspace disease.  No pneumothorax or pleural effusion. No displaced fracture. IMPRESSION: Evidence of worsening chronic lung changes and emphysema without superimposed acute cardiopulmonary disease. Electronically Signed   By: Corrie Mckusick D.O.   On: 05/12/2018 11:03    EKG:   Orders placed or performed during the hospital encounter of 05/12/18  . EKG 12-Lead  . EKG 12-Lead  . ED EKG within 10 minutes  . ED EKG within 10 minutes  ASSESSMENT AND PLAN:    Acute COPD exacerbation with acute respiratory failure with hypoxia' the continue  bronchodilators, IV steroids, add Z-Pak.  She is still wheezing. #2 hypothyroidism.  Continue Synthyroid.  All the records are reviewed and case discussed with Care Management/Social Workerr. Management plans discussed with the patient, family and they are in agreement.  CODE STATUS: full code  TOTAL TIME TAKING CARE OF THIS PATIENT: 35 min  POSSIBLE D/C IN 1-2DAYS, DEPENDING ON CLINICAL CONDITION.   Epifanio Lesches M.D on 05/13/2018 at 2:08 PM  Between 7am to 6pm - Pager - [phone]  After 6pm go to [email or web address] - password [email or web address] [facility]  Office  [phone]  CC: Primary care physician; Glendon Axe, MD   Note: This dictation was prepared with Dragon dictation along with smaller phrase technology. Any transcriptional errors that result from this process are unintentional.

## 2018-05-13 NOTE — ED Notes (Addendum)
Am labs sent. Pt sitting on side of bed drinking a cup of coffee

## 2018-05-13 NOTE — ED Notes (Signed)
Pt requesting to walk around. Ambulates safely and with oxygen.

## 2018-05-13 NOTE — ED Notes (Signed)
Pt given meal tray. Sitting up to eat.

## 2018-05-13 NOTE — ED Notes (Signed)
Pt given breakfast.

## 2018-05-13 NOTE — ED Notes (Signed)
Report to Kate, RN

## 2018-05-13 NOTE — ED Notes (Signed)
Pt walking around with oxygen. Ambulates safely.

## 2018-05-13 NOTE — ED Notes (Signed)
Nurse and tech from floor transporting pt to inpatient unit. Pt transported in wheelchair, given new oxygen tank for transport. Pt ambulatory to wheelchair.

## 2018-05-13 NOTE — ED Notes (Signed)
MD at bedside. 

## 2018-05-13 NOTE — ED Notes (Signed)
Given coffee

## 2018-05-13 NOTE — ED Notes (Signed)
Admission RN at bedside. 

## 2018-05-13 NOTE — ED Notes (Signed)
Pt ambulatory out to parking lot to make sure car wasn't towed, escorted by this RN. Given coke when walked back into room.

## 2018-05-14 MED ORDER — INSULIN ASPART 100 UNIT/ML ~~LOC~~ SOLN: 0.0000 [IU] | Freq: Three times a day (TID) | SUBCUTANEOUS | Status: DC

## 2018-05-14 MED ORDER — INSULIN ASPART 100 UNIT/ML ~~LOC~~ SOLN: 0.0000 [IU] | Freq: Every day | SUBCUTANEOUS | Status: DC

## 2018-05-14 NOTE — Plan of Care (Signed)
Pt informed me that she was leaving because we were making her worse.  Said we were giving her medication that she was allergic to.  I asked her to please let me call Dr. Vianne Bulls.  Reminded her that we had her medicine stored in the pharmacy and needed to remove IV.  Had Dr. Vianne Bulls paged.  Also informed my charge nurse.  Went to help another patient and by the time I got back the charge nurse was in the pt's room and said she had left and all her belongings were gone.  Concerning b/c of IV still in place and pt would desat into the mid 80's off of O2.  Pt was still wheezing.

## 2018-05-14 NOTE — Progress Notes (Signed)
This charge nurse made aware that patient wanting to leave AMA.  Went to patient's room and patient not in room and all belongings are gone.  Made Dr. Vianne Bulls and  Kennyth Lose, [facility] aware.  Missing patient called.  Called patient's phone number and left message.  Patient unable to be located.  Kennyth Lose, [facility] aware that patient cannot be located.  Clarise Cruz, RN, BSN

## 2018-05-14 NOTE — Progress Notes (Addendum)
[place] at [place] NAME: Kristin Vega    MR#:  [id number]  DATE OF BIRTH:  07/22/1945  Patient admitted for COPD exacerbation, patient went out to smoke last night.  And also left the floor without informing the nurse, patient been missing .  Patient still short of breath and thinks that bronchodilators are giving her lots of cough.  Patient still wheezing.  CHIEF COMPLAINT:   Chief Complaint  Patient presents with  . Shortness of Breath  Told me that she did not eat in 5 months because her house is infested with bugs and could not sleep and eat. Slept well last night, had good breakfast REVIEW OF SYSTEMS:   ROS CONSTITUTIONAL: No fever, fatigue or weakness.  Hoarseness of voice. EYES: No blurred or double vision.  EARS, NOSE, AND THROAT: No tinnitus or ear pain.  RESPIRATORY: Some shortness of breath.  Wheezing or hemoptysis.  Patient also has some cough. CARDIOVASCULAR: No chest pain, orthopnea, edema.  GASTROINTESTINAL: No nausea, vomiting, diarrhea or abdominal pain.  GENITOURINARY: No dysuria, hematuria.  ENDOCRINE: No polyuria, nocturia,  HEMATOLOGY: No anemia, easy bruising or bleeding SKIN: No rash or lesion. MUSCULOSKELETAL: No joint pain or arthritis.   NEUROLOGIC: No tingling, numbness, weakness.  PSYCHIATRY: No anxiety or depression.   DRUG ALLERGIES:   Allergies  Allergen Reactions  . Amoxicillin   . Celebrex [Celecoxib] Hives  . Morphine And Related Nausea And Vomiting  . Onion Swelling  . Prednisone Other (See Comments)    shaky  . Shellfish Allergy Nausea And Vomiting    VITALS:  Blood pressure (!) 149/75, pulse 80, temperature 97.7 F (36.5 C), temperature source Oral, resp. rate 19, height 5' 5.5" (1.664 m), weight 80 kg, SpO2 96 %.  PHYSICAL EXAMINATION:  GENERAL:  72 y.o.-year-old patient lying in the bed with no acute distress.  EYES: Pupils equal, round, reactive to light and  accommodation. No scleral icterus. Extraocular muscles intact.  HEENT: Head atraumatic, normocephalic. Oropharynx and nasopharynx clear.  NECK:  Supple, no jugular venous distention. No thyroid enlargement, no tenderness.  LUNGS: Bilateral expiratory wheeze in all lung fields. CARDIOVASCULAR: S1, S2 normal. No murmurs, rubs, or gallops.  ABDOMEN: Soft, nontender, nondistended. Bowel sounds present. No organomegaly or mass.  EXTREMITIES: No pedal edema, cyanosis, or clubbing.  NEUROLOGIC: Cranial nerves II through XII are intact. Muscle strength 5/5 in all extremities. Sensation intact. Gait not checked.  PSYCHIATRIC: The patient is alert and oriented x 3.  SKIN: No obvious rash, lesion, or ulcer.    LABORATORY PANEL:   CBC Recent Labs  Lab 05/13/18 0449  WBC 6.1  HGB 13.8  HCT 43.7  PLT 212   ------------------------------------------------------------------------------------------------------------------  Chemistries  Recent Labs  Lab 05/13/18 0449  NA 138  K 4.8  CL 100  CO2 31  GLUCOSE 173*  BUN 9  CREATININE 0.50  CALCIUM 8.7*   ------------------------------------------------------------------------------------------------------------------  Cardiac Enzymes Recent Labs  Lab 05/12/18 1041  TROPONINI <0.03   ------------------------------------------------------------------------------------------------------------------  RADIOLOGY:  No results found.  EKG:   Orders placed or performed during the hospital encounter of 05/12/18  . EKG 12-Lead  . EKG 12-Lead  . ED EKG within 10 minutes  . ED EKG within 10 minutes    ASSESSMENT AND PLAN:  Acute respiratory failure with hypoxia, O2 sats were 83 to 84% on room air when he came yesterday.  Spoke with RT.  Patient is on 3 L of oxygen, sats are  96%.  This morning.  Still has some wheezing so continue to wean off oxygen if tolerated, continue steroids, bronchodilators.  Told the patient that she will get some  cough with bronchodilators but they will help with the wheezing and shortness of breath.  Left the hospital hospital without informing the nurse, without signing [place] papers.  Called code for missing person but patient never came back and nurses give up on the patient. 2.  COPD exacerbation, patient is a smoker, she refused nicotine patch, went out to smoke last night explained that she cannot smoke in the hospital, patient left the hospital without informing the staff..  #2. hypothyroidism.  Continue Synthyroid.  All the records are reviewed and case discussed with Care Management/Social Workerr. Management plans discussed with the patient, family and they are in agreement.  CODE STATUS: full code  TOTAL TIME TAKING CARE OF THIS PATIENT: 35 min More than 50% time spent in counseling, coordination of care this morning. POSSIBLE D/C IN 1-2DAYS, DEPENDING ON CLINICAL CONDITION.   Epifanio Lesches M.D on 05/14/2018 at 11:48 AM  Between 7am to 6pm - Pager - [phone]  After 6pm go to [email or web address] - password [email or web address] [facility]  Office  [phone]  CC: Primary care physician; Glendon Axe, MD   Note: This dictation was prepared with Dragon dictation along with smaller phrase technology. Any transcriptional errors that result from this process are unintentional.

## 2018-05-16 DIAGNOSIS — R0609 Other forms of dyspnea: Secondary | ICD-10-CM | POA: Diagnosis not present

## 2018-05-16 DIAGNOSIS — J449 Chronic obstructive pulmonary disease, unspecified: Secondary | ICD-10-CM | POA: Diagnosis not present

## 2018-05-16 DIAGNOSIS — R062 Wheezing: Secondary | ICD-10-CM | POA: Diagnosis not present

## 2018-05-22 DIAGNOSIS — E039 Hypothyroidism, unspecified: Secondary | ICD-10-CM | POA: Diagnosis not present

## 2018-05-22 DIAGNOSIS — J449 Chronic obstructive pulmonary disease, unspecified: Secondary | ICD-10-CM | POA: Diagnosis not present

## 2018-05-22 DIAGNOSIS — J019 Acute sinusitis, unspecified: Secondary | ICD-10-CM | POA: Diagnosis not present

## 2018-06-09 DIAGNOSIS — R0609 Other forms of dyspnea: Secondary | ICD-10-CM | POA: Diagnosis not present

## 2018-07-22 DIAGNOSIS — E7800 Pure hypercholesterolemia, unspecified: Secondary | ICD-10-CM | POA: Diagnosis not present

## 2018-07-22 DIAGNOSIS — E039 Hypothyroidism, unspecified: Secondary | ICD-10-CM | POA: Diagnosis not present

## 2018-07-22 DIAGNOSIS — R600 Localized edema: Secondary | ICD-10-CM | POA: Diagnosis not present

## 2018-07-22 DIAGNOSIS — J449 Chronic obstructive pulmonary disease, unspecified: Secondary | ICD-10-CM | POA: Diagnosis not present

## 2018-08-05 DIAGNOSIS — E039 Hypothyroidism, unspecified: Secondary | ICD-10-CM | POA: Diagnosis not present

## 2018-08-05 DIAGNOSIS — E538 Deficiency of other specified B group vitamins: Secondary | ICD-10-CM | POA: Diagnosis not present

## 2018-08-21 DIAGNOSIS — J449 Chronic obstructive pulmonary disease, unspecified: Secondary | ICD-10-CM | POA: Diagnosis not present

## 2018-08-21 DIAGNOSIS — E039 Hypothyroidism, unspecified: Secondary | ICD-10-CM | POA: Diagnosis not present

## 2018-08-21 DIAGNOSIS — E7800 Pure hypercholesterolemia, unspecified: Secondary | ICD-10-CM | POA: Diagnosis not present

## 2018-08-21 DIAGNOSIS — R600 Localized edema: Secondary | ICD-10-CM | POA: Diagnosis not present

## 2018-08-21 DIAGNOSIS — Z0000 Encounter for general adult medical examination without abnormal findings: Secondary | ICD-10-CM | POA: Diagnosis not present

## 2018-08-21 DIAGNOSIS — D0511 Intraductal carcinoma in situ of right breast: Secondary | ICD-10-CM | POA: Diagnosis not present

## 2019-02-04 DIAGNOSIS — J449 Chronic obstructive pulmonary disease, unspecified: Secondary | ICD-10-CM | POA: Diagnosis not present

## 2019-02-04 DIAGNOSIS — Z1331 Encounter for screening for depression: Secondary | ICD-10-CM | POA: Diagnosis not present

## 2019-02-04 DIAGNOSIS — Z6827 Body mass index (BMI) 27.0-27.9, adult: Secondary | ICD-10-CM | POA: Diagnosis not present

## 2019-02-04 DIAGNOSIS — E079 Disorder of thyroid, unspecified: Secondary | ICD-10-CM | POA: Diagnosis not present

## 2019-02-19 IMAGING — CR DG CHEST 2V
1 series · 2 of 2 positions shown · non-contrast
Comparison: 04/20/2013

CLINICAL DATA: 71-year-old female with a history of shortness of
breath.

EXAM:
CHEST - 2 VIEW

[Series 1: dg chest 2 view · 0.14mm/px · 2 of 2 slices shown]
[im 1/2]
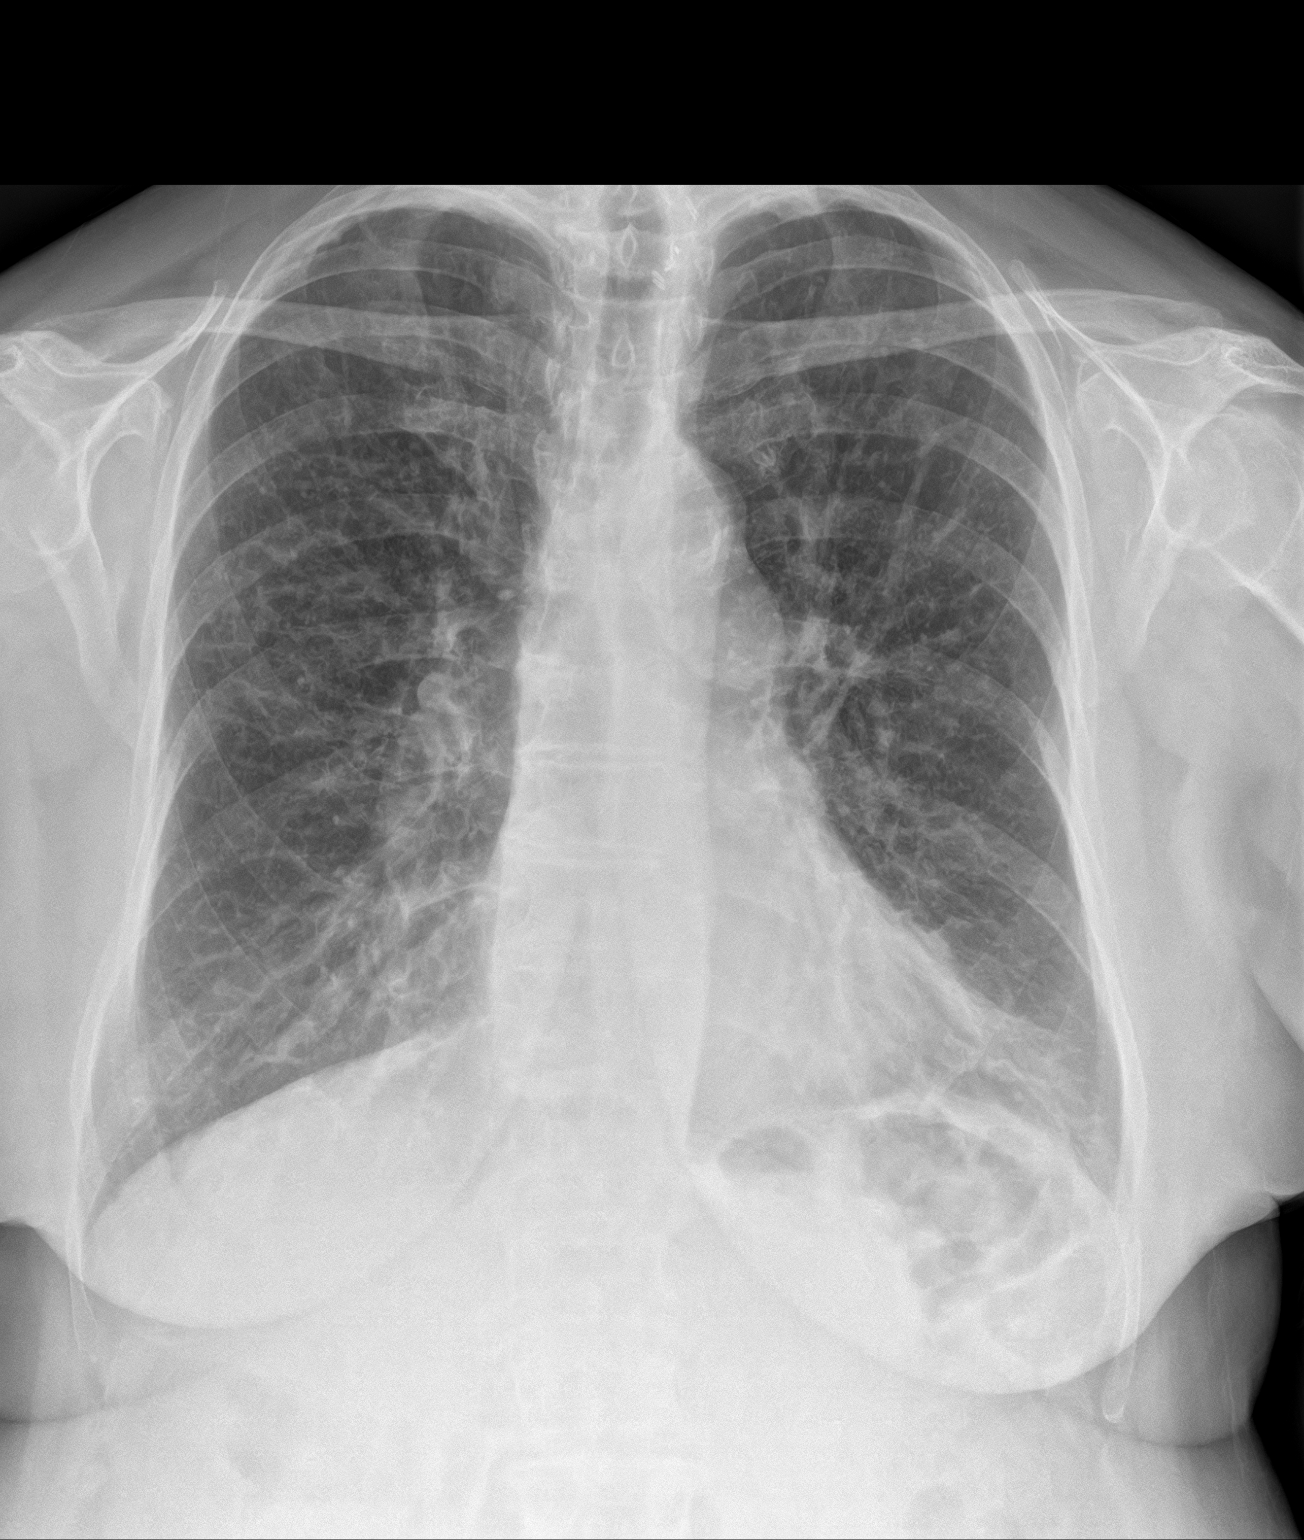
[im 2/2]
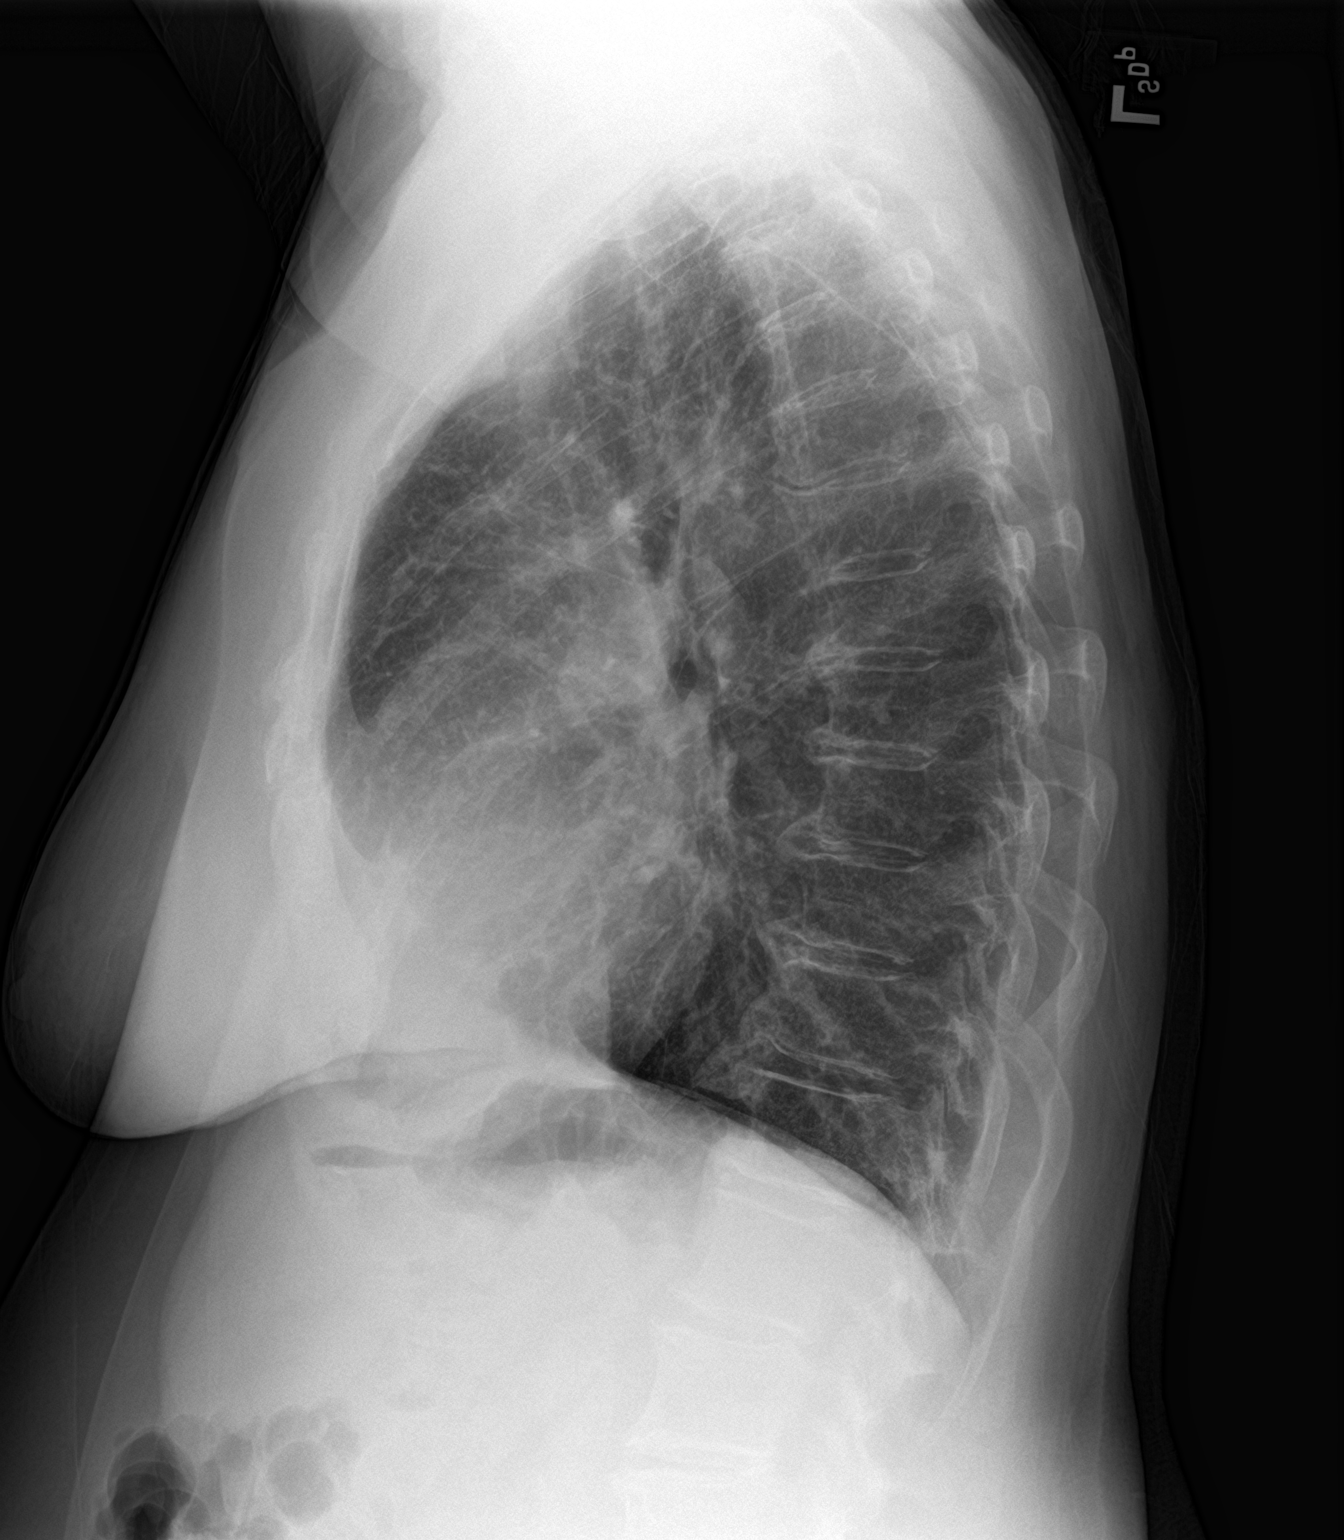

[2 of 2 positions shown; findings below may reference images not displayed]

FINDINGS: Cardiomediastinal silhouette unchanged in size and contour.
Increased pattern of reticular opacities compared to the prior plain
film.

Stigmata of emphysema, with increased retrosternal airspace,
flattened hemidiaphragms, increased AP diameter, and hyperinflation
on the AP view.

No confluent airspace disease.  No pneumothorax or pleural effusion.

No displaced fracture.
IMPRESSION: Evidence of worsening chronic lung changes and emphysema without
superimposed acute cardiopulmonary disease.

## 2019-05-19 DIAGNOSIS — E039 Hypothyroidism, unspecified: Secondary | ICD-10-CM | POA: Diagnosis not present

## 2019-05-19 DIAGNOSIS — Z9181 History of falling: Secondary | ICD-10-CM | POA: Diagnosis not present

## 2019-05-19 DIAGNOSIS — Z23 Encounter for immunization: Secondary | ICD-10-CM | POA: Diagnosis not present

## 2019-05-19 DIAGNOSIS — E538 Deficiency of other specified B group vitamins: Secondary | ICD-10-CM | POA: Diagnosis not present

## 2019-05-19 DIAGNOSIS — Z139 Encounter for screening, unspecified: Secondary | ICD-10-CM | POA: Diagnosis not present

## 2019-05-19 DIAGNOSIS — Z87891 Personal history of nicotine dependence: Secondary | ICD-10-CM | POA: Diagnosis not present

## 2019-05-19 DIAGNOSIS — Z6827 Body mass index (BMI) 27.0-27.9, adult: Secondary | ICD-10-CM | POA: Diagnosis not present

## 2019-05-19 DIAGNOSIS — J449 Chronic obstructive pulmonary disease, unspecified: Secondary | ICD-10-CM | POA: Diagnosis not present

## 2019-05-19 DIAGNOSIS — I700 Atherosclerosis of aorta: Secondary | ICD-10-CM | POA: Diagnosis not present

## 2019-08-17 DIAGNOSIS — Z87891 Personal history of nicotine dependence: Secondary | ICD-10-CM | POA: Diagnosis not present

## 2019-08-17 DIAGNOSIS — J441 Chronic obstructive pulmonary disease with (acute) exacerbation: Secondary | ICD-10-CM | POA: Diagnosis not present

## 2019-09-07 DIAGNOSIS — Z9181 History of falling: Secondary | ICD-10-CM | POA: Diagnosis not present

## 2019-09-07 DIAGNOSIS — Z0000 Encounter for general adult medical examination without abnormal findings: Secondary | ICD-10-CM | POA: Diagnosis not present

## 2019-09-07 DIAGNOSIS — Z1331 Encounter for screening for depression: Secondary | ICD-10-CM | POA: Diagnosis not present

## 2019-11-10 DIAGNOSIS — J019 Acute sinusitis, unspecified: Secondary | ICD-10-CM | POA: Diagnosis not present

## 2019-11-10 DIAGNOSIS — J449 Chronic obstructive pulmonary disease, unspecified: Secondary | ICD-10-CM | POA: Diagnosis not present

## 2019-11-10 DIAGNOSIS — Z20822 Contact with and (suspected) exposure to covid-19: Secondary | ICD-10-CM | POA: Diagnosis not present

## 2019-11-16 DIAGNOSIS — E538 Deficiency of other specified B group vitamins: Secondary | ICD-10-CM | POA: Diagnosis not present

## 2019-11-16 DIAGNOSIS — Z87891 Personal history of nicotine dependence: Secondary | ICD-10-CM | POA: Diagnosis not present

## 2019-11-16 DIAGNOSIS — E039 Hypothyroidism, unspecified: Secondary | ICD-10-CM | POA: Diagnosis not present

## 2019-11-16 DIAGNOSIS — I700 Atherosclerosis of aorta: Secondary | ICD-10-CM | POA: Diagnosis not present

## 2019-11-16 DIAGNOSIS — Z6825 Body mass index (BMI) 25.0-25.9, adult: Secondary | ICD-10-CM | POA: Diagnosis not present

## 2019-11-16 DIAGNOSIS — J019 Acute sinusitis, unspecified: Secondary | ICD-10-CM | POA: Diagnosis not present

## 2019-11-16 DIAGNOSIS — J449 Chronic obstructive pulmonary disease, unspecified: Secondary | ICD-10-CM | POA: Diagnosis not present

## 2020-03-28 DIAGNOSIS — Z20822 Contact with and (suspected) exposure to covid-19: Secondary | ICD-10-CM | POA: Diagnosis not present

## 2020-05-18 DIAGNOSIS — J449 Chronic obstructive pulmonary disease, unspecified: Secondary | ICD-10-CM | POA: Diagnosis not present

## 2020-05-18 DIAGNOSIS — Z87891 Personal history of nicotine dependence: Secondary | ICD-10-CM | POA: Diagnosis not present

## 2020-05-18 DIAGNOSIS — J309 Allergic rhinitis, unspecified: Secondary | ICD-10-CM | POA: Diagnosis not present

## 2020-05-18 DIAGNOSIS — I700 Atherosclerosis of aorta: Secondary | ICD-10-CM | POA: Diagnosis not present

## 2020-05-18 DIAGNOSIS — E538 Deficiency of other specified B group vitamins: Secondary | ICD-10-CM | POA: Diagnosis not present

## 2020-05-18 DIAGNOSIS — E039 Hypothyroidism, unspecified: Secondary | ICD-10-CM | POA: Diagnosis not present

## 2020-05-18 DIAGNOSIS — Z6825 Body mass index (BMI) 25.0-25.9, adult: Secondary | ICD-10-CM | POA: Diagnosis not present

## 2020-06-03 DIAGNOSIS — Z6826 Body mass index (BMI) 26.0-26.9, adult: Secondary | ICD-10-CM | POA: Diagnosis not present

## 2020-06-03 DIAGNOSIS — I999 Unspecified disorder of circulatory system: Secondary | ICD-10-CM | POA: Diagnosis not present

## 2020-06-03 DIAGNOSIS — J449 Chronic obstructive pulmonary disease, unspecified: Secondary | ICD-10-CM | POA: Diagnosis not present

## 2020-06-13 DIAGNOSIS — J449 Chronic obstructive pulmonary disease, unspecified: Secondary | ICD-10-CM | POA: Diagnosis not present

## 2020-06-13 DIAGNOSIS — R590 Localized enlarged lymph nodes: Secondary | ICD-10-CM | POA: Diagnosis not present

## 2020-06-13 DIAGNOSIS — E890 Postprocedural hypothyroidism: Secondary | ICD-10-CM | POA: Diagnosis not present

## 2020-06-13 DIAGNOSIS — Z853 Personal history of malignant neoplasm of breast: Secondary | ICD-10-CM | POA: Diagnosis not present

## 2020-06-13 DIAGNOSIS — Z139 Encounter for screening, unspecified: Secondary | ICD-10-CM | POA: Diagnosis not present

## 2020-06-13 DIAGNOSIS — Z6826 Body mass index (BMI) 26.0-26.9, adult: Secondary | ICD-10-CM | POA: Diagnosis not present

## 2020-06-13 DIAGNOSIS — E079 Disorder of thyroid, unspecified: Secondary | ICD-10-CM | POA: Diagnosis not present

## 2020-07-04 DIAGNOSIS — M4802 Spinal stenosis, cervical region: Secondary | ICD-10-CM | POA: Diagnosis not present

## 2020-07-04 DIAGNOSIS — E890 Postprocedural hypothyroidism: Secondary | ICD-10-CM | POA: Diagnosis not present

## 2020-07-04 DIAGNOSIS — J439 Emphysema, unspecified: Secondary | ICD-10-CM | POA: Diagnosis not present

## 2020-07-04 DIAGNOSIS — M4602 Spinal enthesopathy, cervical region: Secondary | ICD-10-CM | POA: Diagnosis not present

## 2020-07-04 DIAGNOSIS — Z981 Arthrodesis status: Secondary | ICD-10-CM | POA: Diagnosis not present

## 2020-07-04 DIAGNOSIS — R590 Localized enlarged lymph nodes: Secondary | ICD-10-CM | POA: Diagnosis not present

## 2020-07-04 DIAGNOSIS — E079 Disorder of thyroid, unspecified: Secondary | ICD-10-CM | POA: Diagnosis not present

## 2020-07-04 DIAGNOSIS — R599 Enlarged lymph nodes, unspecified: Secondary | ICD-10-CM | POA: Diagnosis not present

## 2020-07-04 DIAGNOSIS — E042 Nontoxic multinodular goiter: Secondary | ICD-10-CM | POA: Diagnosis not present

## 2020-07-06 DIAGNOSIS — Z6826 Body mass index (BMI) 26.0-26.9, adult: Secondary | ICD-10-CM | POA: Diagnosis not present

## 2020-07-06 DIAGNOSIS — F411 Generalized anxiety disorder: Secondary | ICD-10-CM | POA: Diagnosis not present

## 2020-09-13 DIAGNOSIS — E538 Deficiency of other specified B group vitamins: Secondary | ICD-10-CM | POA: Diagnosis not present

## 2020-09-13 DIAGNOSIS — F411 Generalized anxiety disorder: Secondary | ICD-10-CM | POA: Diagnosis not present

## 2020-09-13 DIAGNOSIS — I700 Atherosclerosis of aorta: Secondary | ICD-10-CM | POA: Diagnosis not present

## 2020-09-13 DIAGNOSIS — E079 Disorder of thyroid, unspecified: Secondary | ICD-10-CM | POA: Diagnosis not present

## 2020-09-13 DIAGNOSIS — I8393 Asymptomatic varicose veins of bilateral lower extremities: Secondary | ICD-10-CM | POA: Diagnosis not present

## 2020-09-13 DIAGNOSIS — Z9181 History of falling: Secondary | ICD-10-CM | POA: Diagnosis not present

## 2020-09-13 DIAGNOSIS — J449 Chronic obstructive pulmonary disease, unspecified: Secondary | ICD-10-CM | POA: Diagnosis not present

## 2020-09-13 DIAGNOSIS — Z1331 Encounter for screening for depression: Secondary | ICD-10-CM | POA: Diagnosis not present

## 2020-09-13 DIAGNOSIS — R5383 Other fatigue: Secondary | ICD-10-CM | POA: Diagnosis not present

## 2020-09-13 DIAGNOSIS — L819 Disorder of pigmentation, unspecified: Secondary | ICD-10-CM | POA: Diagnosis not present

## 2020-09-19 ENCOUNTER — Other Ambulatory Visit: Payer: Self-pay | Admitting: Family Medicine

## 2020-09-19 DIAGNOSIS — Z1231 Encounter for screening mammogram for malignant neoplasm of breast: Secondary | ICD-10-CM

## 2020-09-27 DIAGNOSIS — L814 Other melanin hyperpigmentation: Secondary | ICD-10-CM | POA: Diagnosis not present

## 2020-09-27 DIAGNOSIS — L570 Actinic keratosis: Secondary | ICD-10-CM | POA: Diagnosis not present

## 2020-09-27 DIAGNOSIS — L820 Inflamed seborrheic keratosis: Secondary | ICD-10-CM | POA: Diagnosis not present

## 2020-09-27 DIAGNOSIS — D1801 Hemangioma of skin and subcutaneous tissue: Secondary | ICD-10-CM | POA: Diagnosis not present

## 2020-09-27 DIAGNOSIS — L728 Other follicular cysts of the skin and subcutaneous tissue: Secondary | ICD-10-CM | POA: Diagnosis not present

## 2020-10-20 DIAGNOSIS — H524 Presbyopia: Secondary | ICD-10-CM | POA: Diagnosis not present

## 2020-11-10 ENCOUNTER — Inpatient Hospital Stay: Admission: RE | Admit: 2020-11-10 | Payer: PPO | Source: Ambulatory Visit

## 2020-11-10 ENCOUNTER — Ambulatory Visit: Payer: Medicare HMO

## 2020-11-28 DIAGNOSIS — Z6827 Body mass index (BMI) 27.0-27.9, adult: Secondary | ICD-10-CM | POA: Diagnosis not present

## 2020-11-28 DIAGNOSIS — J441 Chronic obstructive pulmonary disease with (acute) exacerbation: Secondary | ICD-10-CM | POA: Diagnosis not present

## 2020-11-28 DIAGNOSIS — Z853 Personal history of malignant neoplasm of breast: Secondary | ICD-10-CM | POA: Diagnosis not present

## 2020-11-28 DIAGNOSIS — F411 Generalized anxiety disorder: Secondary | ICD-10-CM | POA: Diagnosis not present

## 2020-11-28 DIAGNOSIS — E890 Postprocedural hypothyroidism: Secondary | ICD-10-CM | POA: Diagnosis not present

## 2020-11-28 DIAGNOSIS — J449 Chronic obstructive pulmonary disease, unspecified: Secondary | ICD-10-CM | POA: Diagnosis not present

## 2020-11-28 DIAGNOSIS — Z87891 Personal history of nicotine dependence: Secondary | ICD-10-CM | POA: Diagnosis not present

## 2020-11-28 DIAGNOSIS — E039 Hypothyroidism, unspecified: Secondary | ICD-10-CM | POA: Diagnosis not present

## 2020-11-28 DIAGNOSIS — I700 Atherosclerosis of aorta: Secondary | ICD-10-CM | POA: Diagnosis not present

## 2020-11-28 DIAGNOSIS — Z0000 Encounter for general adult medical examination without abnormal findings: Secondary | ICD-10-CM | POA: Diagnosis not present

## 2021-06-01 DIAGNOSIS — I700 Atherosclerosis of aorta: Secondary | ICD-10-CM | POA: Diagnosis not present

## 2021-06-01 DIAGNOSIS — E039 Hypothyroidism, unspecified: Secondary | ICD-10-CM | POA: Diagnosis not present

## 2021-06-01 DIAGNOSIS — Z6826 Body mass index (BMI) 26.0-26.9, adult: Secondary | ICD-10-CM | POA: Diagnosis not present

## 2021-06-01 DIAGNOSIS — I2510 Atherosclerotic heart disease of native coronary artery without angina pectoris: Secondary | ICD-10-CM | POA: Diagnosis not present

## 2021-06-01 DIAGNOSIS — D751 Secondary polycythemia: Secondary | ICD-10-CM | POA: Diagnosis not present

## 2021-06-01 DIAGNOSIS — J439 Emphysema, unspecified: Secondary | ICD-10-CM | POA: Diagnosis not present

## 2021-06-01 DIAGNOSIS — E7800 Pure hypercholesterolemia, unspecified: Secondary | ICD-10-CM | POA: Diagnosis not present

## 2021-07-13 DIAGNOSIS — E039 Hypothyroidism, unspecified: Secondary | ICD-10-CM | POA: Diagnosis not present

## 2021-07-13 DIAGNOSIS — E7800 Pure hypercholesterolemia, unspecified: Secondary | ICD-10-CM | POA: Diagnosis not present

## 2021-10-23 DIAGNOSIS — H25812 Combined forms of age-related cataract, left eye: Secondary | ICD-10-CM | POA: Diagnosis not present

## 2021-10-23 DIAGNOSIS — H25811 Combined forms of age-related cataract, right eye: Secondary | ICD-10-CM | POA: Diagnosis not present

## 2021-11-02 DIAGNOSIS — H25812 Combined forms of age-related cataract, left eye: Secondary | ICD-10-CM | POA: Diagnosis not present

## 2021-11-02 DIAGNOSIS — H25811 Combined forms of age-related cataract, right eye: Secondary | ICD-10-CM | POA: Diagnosis not present

## 2021-11-02 DIAGNOSIS — Z01818 Encounter for other preprocedural examination: Secondary | ICD-10-CM | POA: Diagnosis not present

## 2021-12-05 DIAGNOSIS — Z139 Encounter for screening, unspecified: Secondary | ICD-10-CM | POA: Diagnosis not present

## 2021-12-05 DIAGNOSIS — I2510 Atherosclerotic heart disease of native coronary artery without angina pectoris: Secondary | ICD-10-CM | POA: Diagnosis not present

## 2021-12-05 DIAGNOSIS — Z9181 History of falling: Secondary | ICD-10-CM | POA: Diagnosis not present

## 2021-12-05 DIAGNOSIS — D751 Secondary polycythemia: Secondary | ICD-10-CM | POA: Diagnosis not present

## 2021-12-05 DIAGNOSIS — I700 Atherosclerosis of aorta: Secondary | ICD-10-CM | POA: Diagnosis not present

## 2021-12-05 DIAGNOSIS — E7800 Pure hypercholesterolemia, unspecified: Secondary | ICD-10-CM | POA: Diagnosis not present

## 2021-12-05 DIAGNOSIS — J439 Emphysema, unspecified: Secondary | ICD-10-CM | POA: Diagnosis not present

## 2021-12-05 DIAGNOSIS — E039 Hypothyroidism, unspecified: Secondary | ICD-10-CM | POA: Diagnosis not present

## 2021-12-05 DIAGNOSIS — Z1331 Encounter for screening for depression: Secondary | ICD-10-CM | POA: Diagnosis not present

## 2021-12-21 DIAGNOSIS — Z961 Presence of intraocular lens: Secondary | ICD-10-CM | POA: Diagnosis not present

## 2022-02-05 ENCOUNTER — Other Ambulatory Visit: Payer: Self-pay

## 2022-02-05 ENCOUNTER — Encounter: Payer: Self-pay | Admitting: Ophthalmology

## 2022-02-08 NOTE — Discharge Instructions (Signed)

## 2022-02-12 NOTE — Anesthesia Preprocedure Evaluation (Signed)
Anesthesia Evaluation  Patient identified by MRN, date of birth, ID band Patient awake    Reviewed: Allergy & Precautions, NPO status , Patient's Chart, lab work & pertinent test results  History of Anesthesia Complications Negative for: history of anesthetic complications  Airway Mallampati: III  TM Distance: >3 FB Neck ROM: full    Dental no notable dental hx.    Pulmonary COPD, Current Smoker,    Pulmonary exam normal        Cardiovascular negative cardio ROS Normal cardiovascular exam     Neuro/Psych negative neurological ROS  negative psych ROS   GI/Hepatic negative GI ROS, Neg liver ROS,   Endo/Other  negative endocrine ROS  Renal/GU      Musculoskeletal   Abdominal   Peds  Hematology negative hematology ROS (+)   Anesthesia Other Findings Past Medical History: No date: Breast cancer ([place]) No date: Breast cancer ([place]) No date: COPD (chronic obstructive pulmonary disease) ([facility]) No date: Tobacco use  Past Surgical History: 2013: BREAST BIOPSY; Right     Comment:  core + 2013: BREAST LUMPECTOMY; Right No date: BREAST LUMPECTOMY; Right  BMI    Body Mass Index: 24.58 kg/m      Reproductive/Obstetrics negative OB ROS                             Anesthesia Physical Anesthesia Plan  ASA: 2  Anesthesia Plan: MAC   Post-op Pain Management:    Induction:   PONV Risk Score and Plan:   Airway Management Planned:   Additional Equipment:   Intra-op Plan:   Post-operative Plan:   Informed Consent:   Plan Discussed with: Anesthesiologist, CRNA and Surgeon  Anesthesia Plan Comments:         Anesthesia Quick Evaluation

## 2022-02-13 ENCOUNTER — Ambulatory Visit (AMBULATORY_SURGERY_CENTER): Payer: PPO | Admitting: Anesthesiology

## 2022-02-13 ENCOUNTER — Encounter: Payer: Self-pay | Admitting: Ophthalmology

## 2022-02-13 ENCOUNTER — Encounter
Admission: RE | Disposition: A | Discharge status: Home / Self Care | Payer: Self-pay | Source: Home / Self Care | Attending: Ophthalmology

## 2022-02-13 ENCOUNTER — Other Ambulatory Visit: Payer: Self-pay

## 2022-02-13 ENCOUNTER — Ambulatory Visit
Admission: RE | Admit: 2022-02-13 | Discharge: 2022-02-13 | Disposition: A | Discharge status: Home / Self Care | Payer: PPO | Attending: Ophthalmology | Admitting: Ophthalmology

## 2022-02-13 ENCOUNTER — Ambulatory Visit: Payer: PPO | Admitting: Anesthesiology

## 2022-02-13 DIAGNOSIS — Z853 Personal history of malignant neoplasm of breast: Secondary | ICD-10-CM | POA: Insufficient documentation

## 2022-02-13 DIAGNOSIS — F17210 Nicotine dependence, cigarettes, uncomplicated: Secondary | ICD-10-CM

## 2022-02-13 DIAGNOSIS — J449 Chronic obstructive pulmonary disease, unspecified: Secondary | ICD-10-CM | POA: Insufficient documentation

## 2022-02-13 DIAGNOSIS — Z803 Family history of malignant neoplasm of breast: Secondary | ICD-10-CM | POA: Diagnosis not present

## 2022-02-13 DIAGNOSIS — H2511 Age-related nuclear cataract, right eye: Secondary | ICD-10-CM | POA: Insufficient documentation

## 2022-02-13 HISTORY — PX: CATARACT EXTRACTION W/PHACO: SHX586

## 2022-02-13 SURGERY — PHACOEMULSIFICATION, CATARACT, WITH IOL INSERTION
Anesthesia: Monitor Anesthesia Care | Site: Eye | Laterality: Right

## 2022-02-13 MED ORDER — MIDAZOLAM HCL 2 MG/2ML IJ SOLN
INTRAMUSCULAR | Status: DC | PRN
  Administered 2022-02-13: 1 mg via INTRAVENOUS

## 2022-02-13 MED ORDER — SIGHTPATH DOSE#1 NA CHONDROIT SULF-NA HYALURON 40-17 MG/ML IO SOLN
INTRAOCULAR | Status: DC | PRN
  Administered 2022-02-13: 1 mL via INTRAOCULAR

## 2022-02-13 MED ORDER — SIGHTPATH DOSE#1 BSS IO SOLN
INTRAOCULAR | Status: DC | PRN
  Administered 2022-02-13: 61 mL via OPHTHALMIC

## 2022-02-13 MED ORDER — CEFUROXIME OPHTHALMIC INJECTION 1 MG/0.1 ML
INJECTION | OPHTHALMIC | Status: DC | PRN
  Administered 2022-02-13: 0.1 mL via INTRACAMERAL

## 2022-02-13 MED ORDER — TETRACAINE HCL 0.5 % OP SOLN
1.0000 [drp] | OPHTHALMIC | Status: AC | PRN
  Administered 2022-02-13 (×3): 1 [drp] via OPHTHALMIC

## 2022-02-13 MED ORDER — CYCLOPENTOLATE HCL 2 % OP SOLN
1.0000 [drp] | OPHTHALMIC | Status: AC | PRN
  Administered 2022-02-13 (×3): 1 [drp] via OPHTHALMIC

## 2022-02-13 MED ORDER — SIGHTPATH DOSE#1 BSS IO SOLN
INTRAOCULAR | Status: DC | PRN
  Administered 2022-02-13: 1 mL via INTRAMUSCULAR

## 2022-02-13 MED ORDER — PHENYLEPHRINE HCL 10 % OP SOLN
1.0000 [drp] | OPHTHALMIC | Status: AC | PRN
  Administered 2022-02-13 (×3): 1 [drp] via OPHTHALMIC

## 2022-02-13 MED ORDER — FENTANYL CITRATE (PF) 100 MCG/2ML IJ SOLN
INTRAMUSCULAR | Status: DC | PRN
  Administered 2022-02-13 (×2): 25 ug via INTRAVENOUS

## 2022-02-13 MED ORDER — SIGHTPATH DOSE#1 BSS IO SOLN
INTRAOCULAR | Status: DC | PRN
  Administered 2022-02-13: 15 mL

## 2022-02-13 MED ORDER — BRIMONIDINE TARTRATE-TIMOLOL 0.2-0.5 % OP SOLN
OPHTHALMIC | Status: DC | PRN
  Administered 2022-02-13: 1 [drp] via OPHTHALMIC

## 2022-02-13 SURGICAL SUPPLY — 11 items
CATARACT SUITE SIGHTPATH (MISCELLANEOUS) ×2 IMPLANT
FEE CATARACT SUITE SIGHTPATH (MISCELLANEOUS) ×1 IMPLANT
GLOVE SURG ENC TEXT LTX SZ8 (GLOVE) ×2 IMPLANT
GLOVE SURG TRIUMPH 8.0 PF LTX (GLOVE) ×2 IMPLANT
LENS IOL CLAREON CLEAR 20.5 (Intraocular Lens) ×2 IMPLANT
LENS IOL CLRN CLEAR 20.5 (Intraocular Lens) IMPLANT
NDL FILTER BLUNT 18X1 1/2 (NEEDLE) ×1 IMPLANT
NEEDLE FILTER BLUNT 18X 1/2SAF (NEEDLE) ×1
NEEDLE FILTER BLUNT 18X1 1/2 (NEEDLE) ×1 IMPLANT
SYR 3ML LL SCALE MARK (SYRINGE) ×2 IMPLANT
WATER STERILE IRR 250ML POUR (IV SOLUTION) ×2 IMPLANT

## 2022-02-13 NOTE — Anesthesia Postprocedure Evaluation (Signed)
Anesthesia Post Note  Patient: Kristin Vega  Procedure(s) Performed: CATARACT EXTRACTION PHACO AND INTRAOCULAR LENS PLACEMENT (IOC) RIGHT 8.48 00:56.0 (Right: Eye)     Patient location during evaluation: PACU Anesthesia Type: MAC Level of consciousness: awake and alert Pain management: pain level controlled Vital Signs Assessment: post-procedure vital signs reviewed and stable Respiratory status: spontaneous breathing, nonlabored ventilation and respiratory function stable Cardiovascular status: blood pressure returned to baseline and stable Postop Assessment: no apparent nausea or vomiting Anesthetic complications: no   No notable events documented.  Iran Ouch

## 2022-02-13 NOTE — H&P (Signed)
[facility]   Primary Care Physician:  Hamrick, Lorin Mercy, MD Ophthalmologist: Dr. George Ina  Pre-Procedure History & Physical: HPI:  Kristin Vega is a 76 y.o. female here for cataract surgery.   Past Medical History:  Diagnosis Date   Breast cancer ([place])    Breast cancer ([employer])    COPD (chronic obstructive pulmonary disease) ([place])    Tobacco use     Past Surgical History:  Procedure Laterality Date   BREAST BIOPSY Right 2013   core +   BREAST LUMPECTOMY Right 2013   BREAST LUMPECTOMY Right     Prior to Admission medications   Medication Sig Start Date End Date Taking? Authorizing Provider  ADVAIR DISKUS 100-50 MCG/DOSE AEPB Inhale 1 puff into the lungs 2 (two) times daily. 04/07/18  Yes [provider]  hydrocortisone 1 % lotion Apply 1 application topically 2 (two) times daily. 04/12/18  Yes Vallarie Mare M, PA-C  ipratropium (ATROVENT HFA) 17 MCG/ACT inhaler Inhale 2 puffs into the lungs every 6 (six) hours.   Yes [provider]  levothyroxine (SYNTHROID, LEVOTHROID) 75 MCG tablet Take 75 mcg by mouth daily. 04/22/18  Yes [provider]  aspirin 325 MG tablet Take 325 mg by mouth daily as needed for mild pain.    [provider]    Allergies as of 12/26/2021 - Review Complete 05/13/2018  Allergen Reaction Noted   Amoxicillin  04/12/2018   Celebrex [celecoxib] Hives 04/12/2018   Morphine and related Nausea And Vomiting 04/12/2018   Onion Swelling 04/12/2018   Prednisone Other (See Comments) 04/12/2018   Shellfish allergy Nausea And Vomiting 04/12/2018    Family History  Problem Relation Age of Onset   Breast cancer Maternal Aunt 70    Social History   Socioeconomic History   Marital status: Divorced    Spouse name: Not on file   Number of children: Not on file   Years of education: Not on file   Highest education level: Not on file  Occupational History   Not on file  Tobacco Use   Smoking status: Every Day     Packs/day: 1.00    Types: Cigarettes   Smokeless tobacco: Never  Substance and Sexual Activity   Alcohol use: Yes    Comment: occ   Drug use: Never   Sexual activity: Not Currently  Other Topics Concern   Not on file  Social History Narrative   Not on file   Social Determinants of Health   Financial Resource Strain: Not on file  Food Insecurity: Not on file  Transportation Needs: Not on file  Physical Activity: Not on file  Stress: Not on file  Social Connections: Not on file  Intimate Partner Violence: Not on file    Review of Systems: See HPI, otherwise negative ROS  Physical Exam: Wt 68 kg   BMI 24.58 kg/m  General:   Alert, cooperative in NAD Head:  Normocephalic and atraumatic. Respiratory:  Normal work of breathing. Cardiovascular:  RRR  Impression/Plan: Kristin Vega is here for cataract surgery.  Risks, benefits, limitations, and alternatives regarding cataract surgery have been reviewed with the patient.  Questions have been answered.  All parties agreeable.   Birder Robson, MD  02/13/2022, 1:15 PM

## 2022-02-13 NOTE — Transfer of Care (Signed)
Immediate Anesthesia Transfer of Care Note  Patient: Kristin Vega  Procedure(s) Performed: CATARACT EXTRACTION PHACO AND INTRAOCULAR LENS PLACEMENT (IOC) RIGHT 8.48 00:56.0 (Right: Eye)  Patient Location: PACU  Anesthesia Type:MAC  Level of Consciousness: drowsy and patient cooperative  Airway & Oxygen Therapy: Patient Spontanous Breathing  Post-op Assessment: Report given to RN and Post -op Vital signs reviewed and stable  Post vital signs: Reviewed and stable  Last Vitals:  Vitals Value Taken Time  BP    Temp    Pulse 53 02/13/22 1346  Resp 11 02/13/22 1346  SpO2 97 % 02/13/22 1346  Vitals shown include unvalidated device data.  Last Pain:  Vitals:   02/13/22 1300  TempSrc: Temporal  PainSc: 0-No pain      Patients Stated Pain Goal: 0 (79/43/27 6147)  Complications: No notable events documented.

## 2022-02-13 NOTE — Op Note (Signed)
PREOPERATIVE DIAGNOSIS:  Nuclear sclerotic cataract of the right eye.   POSTOPERATIVE DIAGNOSIS:  H25.11 Cataract   OPERATIVE PROCEDURE:ORPROCALL@   SURGEON:  Birder Robson, MD.   ANESTHESIA:  Anesthesiologist: [place] Ouch, MD CRNA: Lia Foyer, CRNA  1.      Managed anesthesia care. 2.      0.42m of Shugarcaine was instilled in the eye following the paracentesis.   COMPLICATIONS:  None.   TECHNIQUE:   Stop and chop   DESCRIPTION OF PROCEDURE:  The patient was examined and consented in the preoperative holding area where the aforementioned topical anesthesia was applied to the right eye and then brought back to the Operating Room where the right eye was prepped and draped in the usual sterile ophthalmic fashion and a lid speculum was placed. A paracentesis was created with the side port blade and the anterior chamber was filled with viscoelastic. A near clear corneal incision was performed with the steel keratome. A continuous curvilinear capsulorrhexis was performed with a cystotome followed by the capsulorrhexis forceps. Hydrodissection and hydrodelineation were carried out with BSS on a blunt cannula. The lens was removed in a stop and chop  technique and the remaining cortical material was removed with the irrigation-aspiration handpiece. The capsular bag was inflated with viscoelastic and the [place]  lens was placed in the capsular bag without complication. The remaining viscoelastic was removed from the eye with the irrigation-aspiration handpiece. The wounds were hydrated. The anterior chamber was flushed with BSS and the eye was inflated to physiologic pressure. 0.163mof Vigamox was placed in the anterior chamber. The wounds were found to be water tight. The eye was dressed with Combigan. The patient was given protective glasses to wear throughout the day and a shield with which to sleep tonight. The patient was also given drops with which to begin a drop regimen  today and will follow-up with me in one day. Implant Name Type Inv. Item Serial No. Manufacturer Lot No. LRB No. Used Action  LENS IOL CLAREON CLEAR 20.5 - [id number] Lens LENS IOL CLAREON CLEAR 20.5 [id number]  Right 1 Implanted   Procedure(s) with comments: CATARACT EXTRACTION PHACO AND INTRAOCULAR LENS PLACEMENT (IOC) RIGHT 8.48 00:56.0 (Right) - leave last due to transportation  Electronically signed: WiBirder Robson/07/2021 1:44 PM

## 2022-02-14 ENCOUNTER — Encounter: Payer: Self-pay | Admitting: Ophthalmology

## 2022-03-20 DIAGNOSIS — E039 Hypothyroidism, unspecified: Secondary | ICD-10-CM | POA: Diagnosis not present

## 2022-03-20 DIAGNOSIS — R5383 Other fatigue: Secondary | ICD-10-CM | POA: Diagnosis not present

## 2022-03-20 DIAGNOSIS — Z862 Personal history of diseases of the blood and blood-forming organs and certain disorders involving the immune mechanism: Secondary | ICD-10-CM | POA: Diagnosis not present

## 2022-03-28 DIAGNOSIS — Z6825 Body mass index (BMI) 25.0-25.9, adult: Secondary | ICD-10-CM | POA: Diagnosis not present

## 2022-03-28 DIAGNOSIS — J441 Chronic obstructive pulmonary disease with (acute) exacerbation: Secondary | ICD-10-CM | POA: Diagnosis not present

## 2022-03-28 DIAGNOSIS — J111 Influenza due to unidentified influenza virus with other respiratory manifestations: Secondary | ICD-10-CM | POA: Diagnosis not present

## 2022-03-28 DIAGNOSIS — J029 Acute pharyngitis, unspecified: Secondary | ICD-10-CM | POA: Diagnosis not present

## 2022-03-28 DIAGNOSIS — R6889 Other general symptoms and signs: Secondary | ICD-10-CM | POA: Diagnosis not present

## 2022-05-01 ENCOUNTER — Other Ambulatory Visit: Payer: Self-pay | Admitting: Family Medicine

## 2022-05-03 DIAGNOSIS — Z853 Personal history of malignant neoplasm of breast: Secondary | ICD-10-CM | POA: Diagnosis not present

## 2022-05-03 DIAGNOSIS — N644 Mastodynia: Secondary | ICD-10-CM | POA: Diagnosis not present

## 2022-05-04 ENCOUNTER — Other Ambulatory Visit: Payer: Self-pay | Admitting: Family Medicine

## 2022-05-04 DIAGNOSIS — Z853 Personal history of malignant neoplasm of breast: Secondary | ICD-10-CM

## 2022-05-04 DIAGNOSIS — N644 Mastodynia: Secondary | ICD-10-CM

## 2022-05-25 ENCOUNTER — Ambulatory Visit
Admission: RE | Admit: 2022-05-25 | Discharge: 2022-05-25 | Disposition: A | Discharge status: Home / Self Care | Payer: PPO | Source: Ambulatory Visit | Attending: Family Medicine | Admitting: Family Medicine

## 2022-05-25 DIAGNOSIS — Z853 Personal history of malignant neoplasm of breast: Secondary | ICD-10-CM | POA: Diagnosis not present

## 2022-05-25 DIAGNOSIS — N6489 Other specified disorders of breast: Secondary | ICD-10-CM | POA: Diagnosis not present

## 2022-05-25 DIAGNOSIS — N644 Mastodynia: Secondary | ICD-10-CM

## 2022-05-25 DIAGNOSIS — R9230 Dense breasts, unspecified: Secondary | ICD-10-CM | POA: Diagnosis not present

## 2022-05-25 HISTORY — DX: Personal history of irradiation: Z92.3

## 2022-05-29 DIAGNOSIS — L299 Pruritus, unspecified: Secondary | ICD-10-CM | POA: Diagnosis not present

## 2022-05-29 DIAGNOSIS — R519 Headache, unspecified: Secondary | ICD-10-CM | POA: Diagnosis not present

## 2022-05-29 DIAGNOSIS — R202 Paresthesia of skin: Secondary | ICD-10-CM | POA: Diagnosis not present

## 2022-06-08 DIAGNOSIS — L299 Pruritus, unspecified: Secondary | ICD-10-CM | POA: Diagnosis not present

## 2022-06-08 DIAGNOSIS — R519 Headache, unspecified: Secondary | ICD-10-CM | POA: Diagnosis not present

## 2022-06-08 DIAGNOSIS — M542 Cervicalgia: Secondary | ICD-10-CM | POA: Diagnosis not present
